# Patient Record
Sex: Male | Born: 2000 | Race: White | Hispanic: No | Marital: Single | Smoking: Never smoker
Health system: Southern US, Community
[De-identification: ages and names within clinical notes are randomized; demographics above are authoritative.]

## PROBLEM LIST (undated history)

## (undated) DIAGNOSIS — J45909 Unspecified asthma, uncomplicated: Secondary | ICD-10-CM

## (undated) HISTORY — DX: Unspecified asthma, uncomplicated: J45.909

## (undated) HISTORY — PX: CYST REMOVAL WITH BONE GRAFT: SHX6365

---
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
## 2018-02-04 ENCOUNTER — Ambulatory Visit: Payer: Managed Care, Other (non HMO) | Admitting: Family Medicine

## 2018-02-04 ENCOUNTER — Encounter: Payer: Self-pay | Admitting: Family Medicine

## 2018-02-04 ENCOUNTER — Other Ambulatory Visit: Payer: Self-pay | Admitting: Family Medicine

## 2018-02-04 VITALS — BP 128/72 | Temp 98.2°F | Ht 73.03 in | Wt 197.0 lb

## 2018-02-04 DIAGNOSIS — J40 Bronchitis, not specified as acute or chronic: Secondary | ICD-10-CM

## 2018-02-04 DIAGNOSIS — J329 Chronic sinusitis, unspecified: Secondary | ICD-10-CM | POA: Diagnosis not present

## 2018-02-04 MED ORDER — BENZONATATE 150 MG PO CAPS: 150.0000 mg | ORAL_CAPSULE | Freq: Three times a day (TID) | ORAL | 0 refills | Status: DC | PRN

## 2018-02-04 MED ORDER — AZITHROMYCIN 250 MG PO TABS: ORAL_TABLET | ORAL | 0 refills | Status: DC

## 2018-02-04 NOTE — Patient Instructions (Signed)
Upper Respiratory Infection, Adult Most upper respiratory infections (URIs) are caused by a virus. A URI affects the nose, throat, and upper air passages. The most common type of URI is often called "the common cold." Follow these instructions at home:  Take medicines only as told by your doctor.  Gargle warm saltwater or take cough drops to comfort your throat as told by your doctor.  Use a warm mist humidifier or inhale steam from a shower to increase air moisture. This may make it easier to breathe.  Drink enough fluid to keep your pee (urine) clear or pale yellow.  Eat soups and other clear broths.  Have a healthy diet.  Rest as needed.  Go back to work when your fever is gone or your doctor says it is okay. ? You may need to stay home longer to avoid giving your URI to others. ? You can also wear a face mask and wash your hands often to prevent spread of the virus.  Use your inhaler more if you have asthma.  Do not use any tobacco products, including cigarettes, chewing tobacco, or electronic cigarettes. If you need help quitting, ask your doctor. Contact a doctor if:  You are getting worse, not better.  Your symptoms are not helped by medicine.  You have chills.  You are getting more short of breath.  You have brown or red mucus.  You have yellow or brown discharge from your nose.  You have pain in your face, especially when you bend forward.  You have a fever.  You have puffy (swollen) neck glands.  You have pain while swallowing.  You have white areas in the back of your throat. Get help right away if:  You have very bad or constant: ? Headache. ? Ear pain. ? Pain in your forehead, behind your eyes, and over your cheekbones (sinus pain). ? Chest pain.  You have long-lasting (chronic) lung disease and any of the following: ? Wheezing. ? Long-lasting cough. ? Coughing up blood. ? A change in your usual mucus.  You have a stiff neck.  You have  changes in your: ? Vision. ? Hearing. ? Thinking. ? Mood. This information is not intended to replace advice given to you by your health care provider. Make sure you discuss any questions you have with your health care provider. Document Released: 09/23/2007 Document Revised: 12/08/2015 Document Reviewed: 07/12/2013 Elsevier Interactive Patient Education  2018 Elsevier Inc.  

## 2018-02-04 NOTE — Progress Notes (Signed)
Chief Complaint  Patient presents with  . Cough    x 5 days. Fever earlier in the week. Has taken cough medicine.     HPI  Patient presents today for Patient presents with upper respiratory congestion.  There is no sore throat. Patient reports coughing frequently as well. Nosputum noted. There is no fever, chills or sweats. The patient reports being short of breath. Has a lot of second hand exposure to smoke in the home.Onset was 5 days ago. Gradually worsening. Tried OTCs without improvement.  PMH: Smoking status noted ROS: Per HPI  Objective: BP 128/72 (BP Location: Left Arm, Patient Position: Sitting, Cuff Size: Normal)   Temp 98.2 F (36.8 C) (Oral)   Ht 6' 1.03" (1.855 m)   Wt 197 lb (89.4 kg)   SpO2 98%   BMI 25.97 kg/m  Gen: NAD, alert, cooperative with exam HEENT: NCAT, Nasal passages swollen, red TMS clear CV: RRR, good S1/S2, no murmur Resp: Bronchitis changes with scattered wheezes, non-labored Ext: No edema, warm Neuro: Alert and oriented, No gross deficits  Assessment and plan:  1. Sinobronchitis     Meds ordered this encounter  Medications  . azithromycin (ZITHROMAX Z-PAK) 250 MG tablet    Sig: Take two right away Then one a day for the next 4 days.    Dispense:  6 each    Refill:  0  . Benzonatate 150 MG CAPS    Sig: Take 1 capsule (150 mg total) by mouth 3 (three) times daily as needed (cough).    Dispense:  30 capsule    Refill:  0    No orders of the defined types were placed in this encounter.   Follow up as needed.  Mechele Claude, MD

## 2018-02-07 ENCOUNTER — Other Ambulatory Visit: Payer: Self-pay | Admitting: Family Medicine

## 2018-02-07 MED ORDER — BENZONATATE 200 MG PO CAPS: 200.0000 mg | ORAL_CAPSULE | Freq: Three times a day (TID) | ORAL | 0 refills | Status: DC | PRN

## 2018-02-07 NOTE — Telephone Encounter (Signed)
Was already responded to by other means, 200 mg was sent in earlier today

## 2018-02-07 NOTE — Addendum Note (Signed)
Addended by: Julious Payer D on: 02/07/2018 12:10 PM   Modules accepted: Orders

## 2018-02-07 NOTE — Telephone Encounter (Signed)
Pharmacy comment: prefer 100mg  or 200mg  Please advise

## 2018-02-07 NOTE — Addendum Note (Signed)
Addended by: Julious Payer D on: 02/07/2018 12:12 PM   Modules accepted: Orders

## 2018-02-17 ENCOUNTER — Ambulatory Visit (INDEPENDENT_AMBULATORY_CARE_PROVIDER_SITE_OTHER): Payer: Managed Care, Other (non HMO) | Admitting: Family Medicine

## 2018-02-17 ENCOUNTER — Encounter: Payer: Self-pay | Admitting: Family Medicine

## 2018-02-17 VITALS — BP 134/76 | HR 100 | Temp 97.8°F | Ht 73.0 in | Wt 198.0 lb

## 2018-02-17 DIAGNOSIS — R631 Polydipsia: Secondary | ICD-10-CM | POA: Diagnosis not present

## 2018-02-17 DIAGNOSIS — R251 Tremor, unspecified: Secondary | ICD-10-CM | POA: Diagnosis not present

## 2018-02-17 DIAGNOSIS — R5383 Other fatigue: Secondary | ICD-10-CM

## 2018-02-17 NOTE — Patient Instructions (Signed)

## 2018-02-17 NOTE — Progress Notes (Signed)
Subjective:    Patient ID: Lucas Erickson, male    DOB: 2001/02/10, 17 y.o.   MRN: [id number]  Chief Complaint:  Excessive thirst, shakiness,fatigue, weight loss   HPI: Lucas Erickson is a 17 y.o. male presenting on 02/17/2018 for Excessive thirst, shakiness,fatigue, weight loss  Pt presents today with intermittent episodes of light-headedness, shakiness, fatigue, polydipsia, polyuria, and weight loss. Pt is concerned because his mother has a history of hypoglycemia. He states this has been happening over the last few weeks. States it is more noticeable if has not eaten. Denies fever, chills, chest pain, shortness of breath, changes in hair or skin. Denies changes in bowel habits.   Relevant past medical, surgical, family, and social history reviewed and updated as indicated.  Allergies and medications reviewed and updated.   Past Medical History:  Diagnosis Date  . Asthma 2011    History reviewed. No pertinent surgical history.  Social History   Socioeconomic History  . Marital status: Single    Spouse name: Not on file  . Number of children: Not on file  . Years of education: Not on file  . Highest education level: Not on file  Occupational History  . Not on file  Social Needs  . Financial resource strain: Not on file  . Food insecurity:    Worry: Not on file    Inability: Not on file  . Transportation needs:    Medical: Not on file    Non-medical: Not on file  Tobacco Use  . Smoking status: Passive Smoke Exposure - Never Smoker  . Smokeless tobacco: Never Used  Substance and Sexual Activity  . Alcohol use: No  . Drug use: No  . Sexual activity: Not on file  Lifestyle  . Physical activity:    Days per week: Not on file    Minutes per session: Not on file  . Stress: Not on file  Relationships  . Social connections:    Talks on phone: Not on file    Gets together: Not on file    Attends religious service: Not on file    Active member of club or organization:  Not on file    Attends meetings of clubs or organizations: Not on file    Relationship status: Not on file  . Intimate partner violence:    Fear of current or ex partner: Not on file    Emotionally abused: Not on file    Physically abused: Not on file    Forced sexual activity: Not on file  Other Topics Concern  . Not on file  Social History Narrative  . Not on file    Outpatient Encounter Medications as of 02/17/2018  Medication Sig  . [DISCONTINUED] azithromycin (ZITHROMAX Z-PAK) 250 MG tablet Take two right away Then one a day for the next 4 days.  . [DISCONTINUED] benzonatate (TESSALON) 200 MG capsule Take 1 capsule (200 mg total) by mouth 3 (three) times daily as needed for cough.   No facility-administered encounter medications on file as of 02/17/2018.     No Known Allergies  Review of Systems  Constitutional: Positive for appetite change, fatigue and unexpected weight change. Negative for activity change, chills and fever.  Eyes: Negative for photophobia and visual disturbance.  Respiratory: Negative for cough, choking, chest tightness and shortness of breath.   Cardiovascular: Negative for chest pain, palpitations and leg swelling.  Gastrointestinal: Negative for abdominal pain, constipation, diarrhea, nausea and vomiting.  Endocrine: Positive for  polydipsia, polyphagia and polyuria. Negative for cold intolerance and heat intolerance.  Genitourinary: Negative for decreased urine volume, dysuria, flank pain, hematuria, penile pain and urgency.  Musculoskeletal: Negative for arthralgias and myalgias.  Neurological: Positive for dizziness and light-headedness. Negative for tremors, seizures, syncope, facial asymmetry, speech difficulty, weakness, numbness and headaches.  Psychiatric/Behavioral: Negative for confusion.  All other systems reviewed and are negative.       Objective:    BP 134/76   Pulse 100   Temp 97.8 F (36.6 C) (Oral)   Ht _0  (1.854 m)   Wt 198  lb (89.8 kg)   BMI 26.12 kg/m    Wt Readings from Last 3 Encounters:  02/17/18 198 lb (89.8 kg) (92 %, Z= 1.42)*  02/04/18 197 lb (89.4 kg) (92 %, Z= 1.40)*  11/04/17 210 lb (95.3 kg) (96 %, Z= 1.71)*   * Growth percentiles are based on CDC (Boys, 2-20 Years) data.    Physical Exam  Constitutional: He is oriented to person, place, and time. He appears well-developed and well-nourished. He is cooperative. No distress.  HENT:  Head: Normocephalic and atraumatic.  Right Ear: Hearing, tympanic membrane, external ear and ear canal normal.  Left Ear: Hearing, tympanic membrane, external ear and ear canal normal.  Nose: Nose normal.  Mouth/Throat: Uvula is midline, oropharynx is clear and moist and mucous membranes are normal. No oropharyngeal exudate.  Eyes: Pupils are equal, round, and reactive to light. Conjunctivae, EOM and lids are normal.  Neck: Trachea normal, normal range of motion and phonation normal. Neck supple. No JVD present. No tracheal tenderness present. Carotid bruit is not present. No tracheal deviation present. No thyroid mass and no thyromegaly present.  Cardiovascular: Normal rate, regular rhythm, normal heart sounds and intact distal pulses. Exam reveals no gallop and no friction rub.  No murmur heard. Pulmonary/Chest: Effort normal and breath sounds normal. No respiratory distress.  Abdominal: Soft. Bowel sounds are normal. He exhibits no distension and no mass. There is no tenderness. No hernia.  Musculoskeletal: Normal range of motion.  Lymphadenopathy:    He has no cervical adenopathy.  Neurological: He is alert and oriented to person, place, and time. He displays normal reflexes. No cranial nerve deficit or sensory deficit. He exhibits normal muscle tone. Coordination normal.  Skin: Skin is warm and dry. Capillary refill takes less than 2 seconds. No rash noted.  Psychiatric: He has a normal mood and affect. His speech is normal and behavior is normal. Judgment and  thought content normal. Cognition and memory are normal.  Nursing note and vitals reviewed.   Results for orders placed or performed in visit on 11/04/17  Rapid Strep Screen ([facility] ONLY)  Result Value Ref Range   Strep Gp A Ag, IA W/Reflex Negative Negative  Culture, Group A Strep  Result Value Ref Range   Strep A Culture CANCELED   CBC with Differential  Result Value Ref Range   WBC 8.8 3.4 - 10.8 x10E3/uL   RBC 5.14 4.14 - 5.80 x10E6/uL   Hemoglobin 15.0 13.0 - 17.7 g/dL   Hematocrit 43.8 37.5 - 51.0 %   MCV 85 79 - 97 fL   MCH 29.2 26.6 - 33.0 pg   MCHC 34.2 31.5 - 35.7 g/dL   RDW 14.6 12.3 - 15.4 %   Platelets 200 150 - 450 x10E3/uL   Neutrophils 22 Not Estab. %   Lymphs 60 Not Estab. %   Monocytes 14 Not Estab. %  Eos 1 Not Estab. %   Basos 3 Not Estab. %   Neutrophils Absolute 2.0 1.4 - 7.0 x10E3/uL   Lymphocytes Absolute 5.4 (H) 0.7 - 3.1 x10E3/uL   Monocytes Absolute 1.2 (H) 0.1 - 0.9 x10E3/uL   EOS (ABSOLUTE) 0.1 0.0 - 0.4 x10E3/uL   Basophils Absolute 0.2 0.0 - 0.2 x10E3/uL   Immature Granulocytes 0 Not Estab. %   Immature Grans (Abs) 0.0 0.0 - 0.1 x10E3/uL   Hematology Comments: Note:   Mononucleosis Test, Qual W/ Reflex  Result Value Ref Range   Mono Qual W/Rflx Qn Positive (A) Negative  Mononucleosis Test, Quant  Result Value Ref Range   MONO TITER 1:8 (H) Negative       Pertinent labs & imaging results that were available during my care of the patient were reviewed by me and considered in my medical decision making.  Assessment & Plan:  Roark was seen today for excessive thirst, shakiness,fatigue, weight loss.  Diagnoses and all orders for this visit:  Intermittent episodes of light-headedness, shakiness, fatigue, polydipsia, polyuria, and weight loss. Mother has a history of hypoglycemia. Will check for possible hypoglycemia or thyroid diease. Pt will keep a log of episodes, situation surrounding episodes, and check blood sugar during episodes.     Other fatigue -     CMP14+EGFR -     CBC with Differential/Platelet -     TSH  Polydipsia -     CMP14+EGFR -     CBC with Differential/Platelet -     TSH  Shakiness -     CMP14+EGFR -     CBC with Differential/Platelet -     TSH      Continue all other maintenance medications.  Follow up plan: Return if symptoms worsen or fail to improve.  Educational handout given for fatigue  The above assessment and management plan was discussed with the patient. The patient verbalized understanding of and has agreed to the management plan. Patient is aware to call the clinic if symptoms persist or worsen. Patient is aware when to return to the clinic for a follow-up visit. Patient educated on when it is appropriate to go to the emergency department.   Monia Pouch, FNP-C [facility] [phone]

## 2018-02-18 LAB — CBC WITH DIFFERENTIAL/PLATELET
Basophils Absolute: 0.1 10*3/uL (ref 0.0–0.2)
Basos: 1 %
EOS (ABSOLUTE): 0.1 10*3/uL (ref 0.0–0.4)
Eos: 2 %
Hematocrit: 45.5 % (ref 37.5–51.0)
Hemoglobin: 15.5 g/dL (ref 13.0–17.7)
Immature Grans (Abs): 0 10*3/uL (ref 0.0–0.1)
Immature Granulocytes: 0 %
Lymphocytes Absolute: 1.6 10*3/uL (ref 0.7–3.1)
Lymphs: 25 %
MCH: 28.7 pg (ref 26.6–33.0)
MCHC: 34.1 g/dL (ref 31.5–35.7)
MCV: 84 fL (ref 79–97)
Monocytes Absolute: 0.8 10*3/uL (ref 0.1–0.9)
Monocytes: 13 %
Neutrophils Absolute: 3.6 10*3/uL (ref 1.4–7.0)
Neutrophils: 59 %
Platelets: 269 10*3/uL (ref 150–450)
RBC: 5.41 x10E6/uL (ref 4.14–5.80)
RDW: 14 % (ref 12.3–15.4)
WBC: 6.1 10*3/uL (ref 3.4–10.8)

## 2018-02-18 LAB — CMP14+EGFR
ALBUMIN: 4.6 g/dL (ref 3.5–5.5)
ALK PHOS: 108 IU/L (ref 39–117)
ALT: 27 IU/L (ref 0–44)
AST: 24 IU/L (ref 0–40)
Albumin/Globulin Ratio: 1.8 (ref 1.2–2.2)
BILIRUBIN TOTAL: 0.7 mg/dL (ref 0.0–1.2)
BUN/Creatinine Ratio: 10 (ref 9–20)
BUN: 9 mg/dL (ref 6–20)
CHLORIDE: 102 mmol/L (ref 96–106)
CO2: 23 mmol/L (ref 20–29)
CREATININE: 0.91 mg/dL (ref 0.76–1.27)
Calcium: 10 mg/dL (ref 8.7–10.2)
GFR calc Af Amer: 141 mL/min/{1.73_m2} (ref >59)
GFR calc non Af Amer: 122 mL/min/{1.73_m2} (ref >59)
Globulin, Total: 2.6 g/dL (ref 1.5–4.5)
Glucose: 85 mg/dL (ref 65–99)
Potassium: 4.4 mmol/L (ref 3.5–5.2)
Sodium: 141 mmol/L (ref 134–144)
TOTAL PROTEIN: 7.2 g/dL (ref 6.0–8.5)

## 2018-02-18 LAB — TSH: TSH: 1.52 u[IU]/mL (ref 0.450–4.500)

## 2018-05-04 ENCOUNTER — Telehealth: Payer: Self-pay | Admitting: Orthopaedic Surgery

## 2018-05-04 NOTE — Telephone Encounter (Signed)
Patient stopped into our office inquiring about requesting records from his orthopaedist in [place], so that he may schedule appointment. Discussed protocol in which Drs will need to review records/films, and if approved, appointment can then be scheduled.  Faxed patient's signed request to [facility] fax for medical records: #[phone] / ph#[phone] - patient aware of status.

## 2018-05-17 ENCOUNTER — Encounter: Payer: Self-pay | Admitting: Orthopaedic Surgery

## 2018-05-17 ENCOUNTER — Ambulatory Visit: Payer: Managed Care, Other (non HMO) | Admitting: Orthopaedic Surgery

## 2018-05-17 VITALS — BP 139/78 | HR 69 | Ht 73.0 in | Wt 211.0 lb

## 2018-05-17 DIAGNOSIS — M25551 Pain in right hip: Secondary | ICD-10-CM

## 2018-05-17 NOTE — Addendum Note (Signed)
Addended by: Baird Kay on: 05/17/2018 04:47 PM   Modules accepted: Orders
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
## 2018-05-17 NOTE — Patient Instructions (Signed)
Note for work.  Limit activities of extra walking, climbing.

## 2018-05-23 ENCOUNTER — Telehealth: Payer: Self-pay | Admitting: Orthopaedic Surgery

## 2018-05-23 NOTE — Telephone Encounter (Signed)
Patient called stating he has canceled his MRI with his insurance. Stated you can go ahead and order it now.

## 2018-06-01 ENCOUNTER — Ambulatory Visit
Admission: RE | Admit: 2018-06-01 | Discharge: 2018-06-01 | Disposition: A | Discharge status: Home / Self Care | Payer: Managed Care, Other (non HMO) | Source: Ambulatory Visit | Attending: Orthopaedic Surgery | Admitting: Orthopaedic Surgery

## 2018-06-01 DIAGNOSIS — M25551 Pain in right hip: Secondary | ICD-10-CM

## 2018-06-01 MED ORDER — IOPAMIDOL (ISOVUE-M 200) INJECTION 41%
20.0000 mL | Freq: Once | INTRAMUSCULAR | Status: AC
  Administered 2018-06-01: 20 mL via INTRA_ARTICULAR

## 2018-06-07 ENCOUNTER — Encounter: Payer: Self-pay | Admitting: Orthopaedic Surgery

## 2018-06-07 ENCOUNTER — Ambulatory Visit (INDEPENDENT_AMBULATORY_CARE_PROVIDER_SITE_OTHER): Payer: Managed Care, Other (non HMO) | Admitting: Orthopaedic Surgery

## 2018-06-07 VITALS — BP 117/83 | HR 109 | Ht 73.0 in | Wt 211.0 lb

## 2018-06-07 DIAGNOSIS — M25551 Pain in right hip: Secondary | ICD-10-CM

## 2018-06-07 NOTE — Progress Notes (Signed)
Patient NO:Lucas Erickson, male DOB:April 15, 2001, 18 y.o. HAF:[id number]  Chief Complaint  Patient presents with  . Hip Pain    Right hip pain    HPI  Lucas Erickson is a 18 y.o. male who has right hip pain.  He had a MRI which showed: IMPRESSION: 1. No labral tear of the right hip.  No fracture or dislocation. 2. 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 Multiple fluid fluid levels within the mass. The mass abuts the articular surface at the superior anterior acetabulum. Differential considerations include aneurysmal bone cyst, chondromyxoid fibroma, giant cell tumor of the bone. Recommend further evaluation with a MRI of the pelvis without and with intravenous contrast.  His mother is present.  I have explained the findings to both of them and have given them a copy of the MRI.  I will get the new MRI with and without contrast as recommended.    I will also arrange for him to be seen at [facility].  He may need a biopsy done.  They are agreeable to this.    Body mass index is 27.84 kg/m.  ROS  Review of Systems  Constitutional: Positive for activity change.  Musculoskeletal: Positive for arthralgias and gait problem.  All other systems reviewed and are negative.   All other systems reviewed and are negative.  The following is a summary of the past history medically, past history surgically, known current medicines, social history and family history.  This information is gathered electronically by the computer from prior information and documentation.  I review this each visit and have found including this information at this point in the chart is beneficial and informative.    Past Medical History:  Diagnosis Date  . Asthma 2011    History reviewed. No pertinent surgical history.  Family History  Problem Relation Age of Onset  . Diabetes Father        pre-diabetic  . Hypertension Paternal  Grandmother     Social History Social History   Tobacco Use  . Smoking status: Passive Smoke Exposure - Never Smoker  . Smokeless tobacco: Never Used  Substance Use Topics  . Alcohol use: No  . Drug use: No    No Known Allergies  No current outpatient medications on file.   No current facility-administered medications for this visit.      Physical Exam  Blood pressure 117/83, pulse (!) 109, height 6\' 1"  (1.854 m), weight 211 lb (95.7 kg).  Constitutional: overall normal hygiene, normal nutrition, well developed, normal grooming, normal body habitus. Assistive device:none  Musculoskeletal: gait and station Limp right, muscle tone and strength are normal, no tremors or atrophy is present.  .  Neurological: coordination overall normal.  Deep tendon reflex/nerve stretch intact.  Sensation normal.  Cranial nerves II-XII intact.   Skin:   Normal overall no scars, lesions, ulcers or rashes. No psoriasis.  Psychiatric: Alert and oriented x 3.  Recent memory intact, remote memory unclear.  Normal mood and affect. Well groomed.  Good eye contact.  Cardiovascular: overall no swelling, no varicosities, no edema bilaterally, normal temperatures of the legs and arms, no clubbing, cyanosis and good capillary refill.  Lymphatic: palpation is normal.  He has full ROM of the right hip.  NV intact.  Limp to the right.  All other systems reviewed and are negative   The patient has been educated about the nature of the problem(s)  and counseled on treatment options.  The patient appeared to understand what I have discussed and is in agreement with it.  Encounter Diagnosis  Name Primary?  . Pain of right hip joint Yes    PLAN Call if any problems.  Precautions discussed.  Continue current medications.   Return to clinic to get new MRI of the pelvis, then to be seen at [facility].   Electronically Signed Darreld Mclean, MD 2/18/202011:16 AM

## 2018-06-09 ENCOUNTER — Telehealth: Payer: Self-pay | Admitting: Orthopaedic Surgery

## 2018-06-09 DIAGNOSIS — M25551 Pain in right hip: Secondary | ICD-10-CM

## 2018-06-09 NOTE — Telephone Encounter (Signed)
Moxon's grandmother, Stavro Alfredo called for him.  They wanted to know as soon as Devaj has his MRI, can we get him scheduled at [facility] or possibly go ahead and send notes to get him scheduled before he has he MRI.  I told her that I was unsure if Seab's records had already been sent for referral but I would have you call her.  Please call Rudie Serven at [phone]  Thanks

## 2018-06-10 NOTE — Telephone Encounter (Signed)
Grandmother given information to call [facility] and referral with office notes faxed.

## 2018-06-12 ENCOUNTER — Ambulatory Visit
Admission: RE | Admit: 2018-06-12 | Discharge: 2018-06-12 | Disposition: A | Discharge status: Home / Self Care | Payer: Managed Care, Other (non HMO) | Source: Ambulatory Visit | Attending: Orthopaedic Surgery | Admitting: Orthopaedic Surgery

## 2018-06-12 DIAGNOSIS — M25551 Pain in right hip: Secondary | ICD-10-CM

## 2018-06-12 MED ORDER — GADOBENATE DIMEGLUMINE 529 MG/ML IV SOLN
15.0000 mL | Freq: Once | INTRAVENOUS | Status: AC | PRN
  Administered 2018-06-12: 15 mL via INTRAVENOUS

## 2018-07-05 DIAGNOSIS — M8550 Aneurysmal bone cyst, unspecified site: Secondary | ICD-10-CM | POA: Insufficient documentation

## 2018-09-16 HISTORY — PX: OTHER SURGICAL HISTORY: SHX169

## 2018-12-15 ENCOUNTER — Ambulatory Visit: Payer: Managed Care, Other (non HMO) | Attending: Orthopedic Surgery | Admitting: Physical Therapy

## 2018-12-15 ENCOUNTER — Other Ambulatory Visit: Payer: Self-pay

## 2018-12-15 ENCOUNTER — Encounter: Payer: Self-pay | Admitting: Physical Therapy

## 2018-12-15 DIAGNOSIS — R2689 Other abnormalities of gait and mobility: Secondary | ICD-10-CM

## 2018-12-15 DIAGNOSIS — M25551 Pain in right hip: Secondary | ICD-10-CM | POA: Diagnosis present

## 2018-12-15 NOTE — Therapy (Signed)
[facility] [address] [place], [place], [address] Phone: [phone]   Fax:  [phone]  Physical Therapy Evaluation  Patient Details  Name: Lucas Erickson MRN: [id number] Date of Birth: October 29, 2000 Referring Provider (PT): Cynthis Amaryllis DykeLynn Emory MD   Encounter Date: 12/15/2018  PT End of Session - 12/15/18 1330    Visit Number  1    Number of Visits  12    Date for PT Re-Evaluation  01/26/19    Authorization Type  PROGRESS NOTE AT 10TH VISIT.    PT Start Time  0101    PT Stop Time  0128    PT Time Calculation (min)  27 min    Activity Tolerance  Patient tolerated treatment well    Behavior During Therapy  WFL for tasks assessed/performed       Past Medical History:  Diagnosis Date  . Asthma 2011    History reviewed. No pertinent surgical history.  There were no vitals filed for this visit.   Subjective Assessment - 12/15/18 1332    Subjective  COVID-19 screen performed prior to patient entering clinic.  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  He presented to the clinic today with bilateral axillary crutches and weight bearing as tolerated over his right LE.  He can wean from the crutches at this time and he has walked some around his house without his crutches.  His pain-level today is a low 2/1o at rest.    Patient Stated Goals  "Be able to walk like normal again with minimal pain."    Currently in Pain?  Yes    Pain Score  2     Pain Location  Hip    Pain Orientation  Right    Pain Descriptors / Indicators  Aching;Dull;Discomfort    Pain Type  Surgical pain    Pain Onset  More than a month ago    Pain Frequency  Intermittent    Aggravating Factors   Over activity and increased up time.    Pain Relieving Factors  rest.         [facility] PT Assessment - 12/15/18 0001      Assessment   Medical Diagnosis  Neoplasm of unspecified behavior of  bone, soft tissue and skin.    Referring Provider (PT)  Cynthis Amaryllis DykeLynn Emory MD    Onset Date/Surgical Date  --   09/16/18(surgery date).     Precautions   Precaution Comments  Wean from crutches.  Slow and pain-free exercise.      Restrictions   Weight Bearing Restrictions  --   WBAT over right LE.     Balance Screen   Has the patient fallen in the past 6 months  No    Has the patient had a decrease in activity level because of a fear of falling?   No    Is the patient reluctant to leave their home because of a fear of falling?   No      Home Environment   Living Environment  Private residence      Prior Function   Level of Independence  Independent      Observation/Other Assessments   Observations  Long incision in right lower abdominal region that is well healed.      Posture/Postural Control   Posture/Postural Control  No significant limitations      ROM / Strength  AROM / PROM / Strength  AROM;Strength      AROM   Overall AROM Comments  In supine patient's right hip flexion easily performed to 105 degrees without pain increase.      Strength   Overall Strength Comments  Patient demonstrated right hip antigravity motion with difficulty or pain increase.      Palpation   Palpation comment  Mild incisional tenderness.      Ambulation/Gait   Gait Comments  Instructed patient in weaning to one axillary crutch on left.  He did so with excellent technique.                Objective measurements completed on examination: See above findings.                   PT Long Term Goals - 12/15/18 1412      PT LONG TERM GOAL #1   Title  Independent with a HEP.    Time  6    Period  Weeks    Status  New      PT LONG TERM GOAL #2   Title  Increase right hip strength to a solid 5/5 to increase stability for functional activties.    Time  6    Period  Weeks    Status  New      PT LONG TERM GOAL #3   Title  Walk a community distance without assistive  device and no pain.    Time  6    Period  Weeks    Status  New      PT LONG TERM GOAL #4   Title  Perform ADL's with pain not > 2/10.    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  He presented to the clinic today with bilateral axillary crutches and weight bearing as tolerated over his right LE.  He walked in the clinic today with one crutch on left and did an excellent job.  His right lower abdominal incision is well healed.  He easily performed right hip antigavity movement with an increase in pain.  Patient will benefit from skilled physical therapy intervention to address deficits.n.    Examination-Activity Limitations  Locomotion Level    Stability/Clinical Decision Making  Stable/Uncomplicated    Clinical Decision Making  Moderate    Rehab Potential  Excellent    PT Frequency  2x / week    PT Duration  6 weeks    PT Treatment/Interventions  ADLs/Self Care Home Management;Cryotherapy;Electrical Stimulation;Gait training;Moist Heat;Functional mobility training;Therapeutic activities;Therapeutic exercise;Patient/family education;Passive range of motion;Manual techniques    PT Next Visit Plan  Please do FOTO intake next visit.  Nustep, gait training activities, PRE's without pain increase.  Modalites as needed.    Consulted and Agree with Plan of Care  Patient       Patient will benefit from skilled therapeutic intervention in order to improve the following deficits and impairments:  Abnormal gait, Pain, Decreased activity tolerance, Decreased strength  Visit Diagnosis: Pain in right hip - Plan: PT plan of care cert/re-cert  Other abnormalities of gait and mobility - Plan: PT plan of care cert/re-cert     Problem List There  are no active problems to display for this  patient.   Lucas Erickson, [place] MPT 12/15/2018, 2:18 PM  [facility] [address] [place], [place], [address] Phone: [phone]   Fax:  [phone]  Name: Lucas Erickson MRN: [id number] Date of Birth: 04-Apr-2001

## 2018-12-20 ENCOUNTER — Ambulatory Visit: Payer: Managed Care, Other (non HMO) | Attending: Orthopedic Surgery | Admitting: *Deleted

## 2018-12-20 ENCOUNTER — Other Ambulatory Visit: Payer: Self-pay

## 2018-12-20 DIAGNOSIS — M25551 Pain in right hip: Secondary | ICD-10-CM

## 2018-12-20 DIAGNOSIS — R2689 Other abnormalities of gait and mobility: Secondary | ICD-10-CM | POA: Diagnosis present

## 2018-12-20 NOTE — Therapy (Signed)
[facility] [place], [place], [address] Phone: [phone]   Fax:  [phone]  Physical Therapy Treatment  Patient Details  Name: Lucas Erickson MRN: [id number] Date of Birth: [birth year] Referring Provider (PT): Cynthis Beatriz Chancellor MD   Encounter Date: 12/20/2018  PT End of Session - 12/20/18 0160    Visit Number  2    Number of Visits  12    Date for PT Re-Evaluation  01/26/19    Authorization Type  PROGRESS NOTE AT 10TH VISIT.    PT Start Time  0815    PT Stop Time  0904    PT Time Calculation (min)  49 min       Past Medical History:  Diagnosis Date  . Asthma 2011    No past surgical history on file.  There were no vitals filed for this visit.  Subjective Assessment - 12/20/18 0821    Subjective  COVID19 screening performed prior to entering the building. Pt reports RT hip is sore , but doing better    Patient Stated Goals  "Be able to walk like normal again with minimal pain."    Currently in Pain?  Yes    Pain Score  2     Pain Location  Hip    Pain Onset  More than a month ago                       [facility] Adult PT Treatment/Exercise - 12/20/18 0001      Exercises   Exercises  Knee/Hip      Knee/Hip Exercises: Aerobic   Nustep  L4 x 15 mins      Knee/Hip Exercises: Standing   Rocker Board  5 minutes   balance     Knee/Hip Exercises: Seated   Long Arc Quad  Right;Strengthening;3 sets;10 reps    Long Arc Quad Weight  2 lbs.      Knee/Hip Exercises: Supine   Short Arc Quad Sets  Strengthening;Right;3 sets;10 reps    Short Arc Quad Sets Limitations  2#    Bridges  AROM;3 sets;10 reps    Straight Leg Raises  AROM;Right;3 sets;10 reps      Knee/Hip Exercises: Sidelying   Hip ABduction  AROM;Right;3 sets;10 reps    Hip ADduction  AROM;3 sets;10 reps      Knee/Hip Exercises: Prone   Straight Leg Raises  AROM;Right;3 sets;10 reps             PT Education - 12/20/18 0852     Education Details  RT LE mat exs    Person(s) Educated  Patient    Methods  Explanation;Demonstration;Tactile cues;Verbal cues;Handout    Comprehension  Verbalized understanding;Returned demonstration          PT Long Term Goals - 12/15/18 1412      PT LONG TERM GOAL #1   Title  Independent with a HEP.    Time  6    Period  Weeks    Status  New      PT LONG TERM GOAL #2   Title  Increase right hip strength to a solid 5/5 to increase stability for functional activties.    Time  6    Period  Weeks    Status  New      PT LONG TERM GOAL #3   Title  Walk a community distance without assistive device and no pain.    Time  6  Period  Weeks    Status  New      PT LONG TERM GOAL #4   Title  Perform ADL's with pain not > 2/10.    Time  6    Period  Weeks    Status  New            Plan - 12/20/18 [id number]    Clinical Impression Statement  Pt arrived today doing fairly well and was able to perform therex in the clinic for HEP and did well. Sheet given for instruction. He was able to walk in clinic without his crutch today with mild antalgic gait pattern. Notable weakness with SLR and SAQ's.    Examination-Activity Limitations  Locomotion Level    Stability/Clinical Decision Making  Stable/Uncomplicated    Rehab Potential  Excellent    PT Frequency  2x / week    PT Duration  6 weeks    PT Treatment/Interventions  ADLs/Self Care Home Management;Cryotherapy;Electrical Stimulation;Gait training;Moist Heat;Functional mobility training;Therapeutic activities;Therapeutic exercise;Patient/family education;Passive range of motion;Manual techniques    PT Next Visit Plan  Please do FOTO intake next visit.  Nustep, gait training activities, PRE's without pain increase.  Modalites as needed.       Patient will benefit from skilled therapeutic intervention in order to improve the following deficits and impairments:  Abnormal gait, Pain, Decreased activity tolerance, Decreased  strength  Visit Diagnosis: Pain in right hip  Other abnormalities of gait and mobility     Problem List There are no active problems to display for this patient.   RAMSEUR,CHRIS , PTA 12/20/2018, 9:36 AM  [facility] [address] [place], [place], [address] Phone: [phone]   Fax:  [phone]  Name: Lucas Erickson MRN: [id number] Date of Birth: June 22, 2000

## 2018-12-23 ENCOUNTER — Ambulatory Visit: Payer: Managed Care, Other (non HMO) | Admitting: *Deleted

## 2018-12-23 ENCOUNTER — Other Ambulatory Visit: Payer: Self-pay

## 2018-12-23 DIAGNOSIS — M25551 Pain in right hip: Secondary | ICD-10-CM

## 2018-12-23 DIAGNOSIS — R2689 Other abnormalities of gait and mobility: Secondary | ICD-10-CM

## 2018-12-23 NOTE — Therapy (Signed)
[facility] [address] [place], [place], [address] Phone: [phone]   Fax:  [phone]  Physical Therapy Treatment  Patient Details  Name: Lucas Erickson MRN: [id number] Date of Birth: 11-06-2000 Referring Provider (PT): Cynthis Amaryllis DykeLynn Emory MD   Encounter Date: 12/23/2018  PT End of Session - 12/23/18 0912    Visit Number  3    Number of Visits  12    Date for PT Re-Evaluation  01/26/19    Authorization Type  PROGRESS NOTE AT 10TH VISIT.    PT Start Time  0900    PT Stop Time  0947    PT Time Calculation (min)  47 min       Past Medical History:  Diagnosis Date  . Asthma 2011    No past surgical history on file.  There were no vitals filed for this visit.  Subjective Assessment - 12/23/18 0911    Subjective  COVID19 screening performed prior to entering the building. Pt reports RT hip is sore from exs and RTW    Patient Stated Goals  "Be able to walk like normal again with minimal pain."    Currently in Pain?  Yes    Pain Score  2     Pain Location  Hip    Pain Orientation  Right    Pain Descriptors / Indicators  Sore    Pain Type  Surgical pain    Pain Onset  More than a month ago    Pain Frequency  Intermittent                       [facility] Adult PT Treatment/Exercise - 12/23/18 0001      Exercises   Exercises  Knee/Hip      Knee/Hip Exercises: Aerobic   Nustep  L4 x 15 mins      Knee/Hip Exercises: Standing   Rocker Board  3 minutes   balance   Other Standing Knee Exercises  inverted BOSU x 4 mins    Other Standing Knee Exercises  resisted walking XTS orange band x 10 retro and LT side stepping      Knee/Hip Exercises: Seated   Long Arc Quad  Right;Strengthening;3 sets;10 reps    Long Arc Quad Weight  2 lbs.      Knee/Hip Exercises: Supine   Short Arc Quad Sets  Strengthening;Right;3 sets;10 reps    Short Arc Quad Sets Limitations  2#    Bridges with Harley-DavidsonBall Squeeze  Strengthening;3 sets;10  reps    Straight Leg Raises  AROM;Right;3 sets;10 reps                  PT Long Term Goals - 12/15/18 1412      PT LONG TERM GOAL #1   Title  Independent with a HEP.    Time  6    Period  Weeks    Status  New      PT LONG TERM GOAL #2   Title  Increase right hip strength to a solid 5/5 to increase stability for functional activties.    Time  6    Period  Weeks    Status  New      PT LONG TERM GOAL #3   Title  Walk a community distance without assistive device and no pain.    Time  6    Period  Weeks    Status  New      PT  LONG TERM GOAL #4   Title  Perform ADL's with pain not > 2/10.    Time  6    Period  Weeks    Status  New            Plan - 12/23/18 5320    Clinical Impression Statement  Pt arrived today doing fairly well with low pain levels. He was able to perform all therex today for RT LE with mainly soreness and fatigue. He did better with muscle control today during SLR, and LAQs, and SAQs    Examination-Activity Limitations  Locomotion Level    Stability/Clinical Decision Making  Stable/Uncomplicated    Rehab Potential  Excellent    PT Frequency  2x / week    PT Duration  6 weeks    PT Treatment/Interventions  ADLs/Self Care Home Management;Cryotherapy;Electrical Stimulation;Gait training;Moist Heat;Functional mobility training;Therapeutic activities;Therapeutic exercise;Patient/family education;Passive range of motion;Manual techniques    PT Next Visit Plan  Please do FOTO intake next visit.  Nustep, gait training activities, PRE's without pain increase.  Modalites as needed.    Consulted and Agree with Plan of Care  Patient       Patient will benefit from skilled therapeutic intervention in order to improve the following deficits and impairments:  Abnormal gait, Pain, Decreased activity tolerance, Decreased strength  Visit Diagnosis: Other abnormalities of gait and mobility  Pain in right hip     Problem List There are no active  problems to display for this patient.   Rami Budhu,CHRIS, PTA 12/23/2018, 10:18 AM  [facility] [place], [place], [address] Phone: [phone]   Fax:  [phone]  Name: Lucas Erickson MRN: [id number] Date of Birth: 05-05-2000

## 2018-12-28 ENCOUNTER — Other Ambulatory Visit: Payer: Self-pay

## 2018-12-28 ENCOUNTER — Ambulatory Visit: Payer: Managed Care, Other (non HMO) | Admitting: Physical Therapy

## 2018-12-28 ENCOUNTER — Encounter: Payer: Self-pay | Admitting: Physical Therapy

## 2018-12-28 DIAGNOSIS — R2689 Other abnormalities of gait and mobility: Secondary | ICD-10-CM

## 2018-12-28 DIAGNOSIS — M25551 Pain in right hip: Secondary | ICD-10-CM

## 2018-12-28 NOTE — Therapy (Signed)
[facility] [address] [place], [place], [address] Phone: [phone]   Fax:  [phone]  Physical Therapy Treatment  Patient Details  Name: Lucas Erickson MRN: [id number] Date of Birth: Aug 05, 2000 Referring Provider (PT): Cynthis Amaryllis DykeLynn Emory MD   Encounter Date: 12/28/2018  PT End of Session - 12/28/18 0823    Visit Number  4    Number of Visits  12    Date for PT Re-Evaluation  01/26/19    Authorization Type  PROGRESS NOTE AT 10TH VISIT.    PT Start Time  0730    PT Stop Time  0814    PT Time Calculation (min)  44 min    Activity Tolerance  Patient tolerated treatment well    Behavior During Therapy  [facility] for tasks assessed/performed       Past Medical History:  Diagnosis Date  . Asthma 2011    History reviewed. No pertinent surgical history.  There were no vitals filed for this visit.  Subjective Assessment - 12/28/18 0749    Subjective  COVID19 screening performed prior to entering the building. Patient reports no pain in the right hip and has not been using crutches lately.    Patient Stated Goals  "Be able to walk like normal again with minimal pain."    Currently in Pain?  No/denies         [facility] - 12/28/18 0001      Assessment   Medical Diagnosis  Neoplasm of unspecified behavior of bone, soft tissue and skin.    Referring Provider (PT)  Cynthis Amaryllis DykeLynn Emory MD                   [facility] Adult PT Treatment/Exercise - 12/28/18 0001      Exercises   Exercises  Knee/Hip      Knee/Hip Exercises: Aerobic   Nustep  L5 x 15 mins      Knee/Hip Exercises: Standing   Rocker Board  3 minutes   balance with ball toss.   Other Standing Knee Exercises  inverted BOSU with mini squats x 3x10    Other Standing Knee Exercises  4 way resisted walking orange XTS, x5 each      Knee/Hip Exercises: Seated   Long Arc Quad  Right;Strengthening;3 sets;10 reps    Long Arc Quad Weight  2 lbs.      Knee/Hip  Exercises: Supine   Short Arc Quad Sets  Strengthening;Right;3 sets;10 reps    Short Arc Quad Sets Limitations  2#    Straight Leg Raises  AROM;Right;3 sets;10 reps    Other Supine Knee/Hip Exercises  supine clams red theraband 3x10      Knee/Hip Exercises: Sidelying   Other Sidelying Knee/Hip Exercises  right side planks x3 to fatigue                  PT Long Term Goals - 12/15/18 1412      PT LONG TERM GOAL #1   Title  Independent with a HEP.    Time  6    Period  Weeks    Status  New      PT LONG TERM GOAL #2   Title  Increase right hip strength to a solid 5/5 to increase stability for functional activties.    Time  6    Period  Weeks    Status  New      PT LONG TERM GOAL #3   Title  Walk a community distance without assistive device and no pain.    Time  6    Period  Weeks    Status  New      PT LONG TERM GOAL #4   Title  Perform ADL's with pain not > 2/10.    Time  6    Period  Weeks    Status  New            Plan - 12/28/18 0801    Clinical Impression Statement  Patient was able to tolerate progression of exercises well with only minimal reports of soreness particularly with the bosu mini squats. Patient required one verbal cue to reduce speed of SLR and to drop leg down completely before lifting again. Patient and PT discusssed continuing HEP to optimize PT. Patient reported understanding.    Examination-Activity Limitations  Locomotion Level    Stability/Clinical Decision Making  Stable/Uncomplicated    Clinical Decision Making  Moderate    Rehab Potential  Excellent    PT Frequency  2x / week    PT Duration  6 weeks    PT Treatment/Interventions  ADLs/Self Care Home Management;Cryotherapy;Electrical Stimulation;Gait training;Moist Heat;Functional mobility training;Therapeutic activities;Therapeutic exercise;Patient/family education;Passive range of motion;Manual techniques    PT Next Visit Plan  Please do FOTO intake next visit.  Nustep, gait  training activities, PRE's without pain increase.  Modalites as needed.    Consulted and Agree with Plan of Care  Patient       Patient will benefit from skilled therapeutic intervention in order to improve the following deficits and impairments:  Abnormal gait, Pain, Decreased activity tolerance, Decreased strength  Visit Diagnosis: Other abnormalities of gait and mobility  Pain in right hip     Problem List There are no active problems to display for this patient.   Gabriela Eves, PT, DPT 12/28/2018, 8:23 AM  [facility] [address] [place], [place], [address] Phone: [phone]   Fax:  [phone]  Name: Lucas Erickson MRN: [id number] Date of Birth: 2000-06-01

## 2018-12-30 ENCOUNTER — Ambulatory Visit: Payer: Managed Care, Other (non HMO) | Admitting: *Deleted

## 2018-12-30 ENCOUNTER — Other Ambulatory Visit: Payer: Self-pay

## 2018-12-30 DIAGNOSIS — M25551 Pain in right hip: Secondary | ICD-10-CM

## 2018-12-30 DIAGNOSIS — R2689 Other abnormalities of gait and mobility: Secondary | ICD-10-CM

## 2018-12-30 NOTE — Therapy (Signed)
[facility] [address] [place], [place], [address] Phone: [phone]   Fax:  [phone]  Physical Therapy Treatment  Patient Details  Name: Lucas Erickson MRN: [id number] Date of Birth: 2000/10/02 Referring Provider (PT): Cynthis Amaryllis DykeLynn Emory MD   Encounter Date: 12/30/2018  PT End of Session - 12/30/18 0906    Visit Number  5    Number of Visits  12    Date for PT Re-Evaluation  01/26/19    Authorization Type  PROGRESS NOTE AT 10TH VISIT.    PT Start Time  0900    PT Stop Time  0949    PT Time Calculation (min)  49 min       Past Medical History:  Diagnosis Date  . Asthma 2011    No past surgical history on file.  There were no vitals filed for this visit.  Subjective Assessment - 12/30/18 0857    Subjective  COVID19 screening performed prior to entering the building. RT hip is sore from standing a lot yesterday.    Patient Stated Goals  "Be able to walk like normal again with minimal pain."    Currently in Pain?  No/denies    Pain Orientation  Right    Pain Descriptors / Indicators  Sore    Pain Onset  More than a month ago                       [facility] Adult [facility] Treatment/Exercise - 12/30/18 0001      Exercises   Exercises  Knee/Hip      Knee/Hip Exercises: Aerobic   Recumbent Bike  L5 x 10 mins    Stepper  ramp 10, R5 x 5 mins      Knee/Hip Exercises: Standing   Rocker Board  3 minutes   balance with ball toss.   Other Standing Knee Exercises  inverted BOSU with mini squats x 3x10    Other Standing Knee Exercises  4 way resisted walking orange XTS, x10 each      Knee/Hip Exercises: Seated   Long Arc Quad  Right;Strengthening;3 sets;10 reps    Long Arc Quad Weight  4 lbs.      Knee/Hip Exercises: Supine   Short Arc The Timken CompanyQuad Sets  --    Short Arc The Timken CompanyQuad Sets Limitations  --    Bridges with Harley-DavidsonBall Squeeze  Strengthening;3 sets;10 reps    Straight Leg Raises  AROM;Right;3 sets;10 reps                   PT Long Term Goals - 12/15/18 1412      PT LONG TERM GOAL #1   Title  Independent with a HEP.    Time  6    Period  Weeks    Status  New      PT LONG TERM GOAL #2   Title  Increase right hip strength to a solid 5/5 to increase stability for functional activties.    Time  6    Period  Weeks    Status  New      PT LONG TERM GOAL #3   Title  Walk a community distance without assistive device and no pain.    Time  6    Period  Weeks    Status  New      PT LONG TERM GOAL #4   Title  Perform ADL's with pain not > 2/10.    Time  6    Period  Weeks    Status  New            Plan - 12/30/18 0907    Clinical Impression Statement  Pt arrived today doing fairly well, but with some RT hip soreness. He was able to complete all therex for strengthening and balance with mainly fatigue.Improved quad control now with [facility] and [facility] exs.    Examination-Activity Limitations  Locomotion Level    Stability/Clinical Decision Making  Stable/Uncomplicated    Rehab Potential  Excellent    PT Frequency  2x / week    PT Duration  6 weeks    PT Treatment/Interventions  ADLs/Self Care Home Management;Cryotherapy;Electrical Stimulation;Gait training;Moist Heat;Functional mobility training;Therapeutic activities;Therapeutic exercise;Patient/family education;Passive range of motion;Manual techniques    PT Next Visit Plan  Please do FOTO intake next visit.  Nustep, gait training activities, PRE's without pain increase.  Modalites as needed.    Consulted and Agree with Plan of Care  Patient       Patient will benefit from skilled therapeutic intervention in order to improve the following deficits and impairments:  Abnormal gait, Pain, Decreased activity tolerance, Decreased strength  Visit Diagnosis: Pain in right hip  Other abnormalities of gait and mobility     Problem List There are no active problems to display for this patient.   Marjon Doxtater,CHRIS, PTA 12/30/2018,  10:01 AM  [facility] [place], [place], [address] Phone: [phone]   Fax:  [phone]  Name: Lucas Erickson MRN: [id number] Date of Birth: September 28, 2000

## 2019-01-04 ENCOUNTER — Ambulatory Visit: Payer: Managed Care, Other (non HMO) | Admitting: Physical Therapy

## 2019-01-04 ENCOUNTER — Encounter: Payer: Self-pay | Admitting: Physical Therapy

## 2019-01-04 ENCOUNTER — Other Ambulatory Visit: Payer: Self-pay

## 2019-01-04 DIAGNOSIS — R2689 Other abnormalities of gait and mobility: Secondary | ICD-10-CM

## 2019-01-04 DIAGNOSIS — M25551 Pain in right hip: Secondary | ICD-10-CM | POA: Diagnosis not present

## 2019-01-04 NOTE — Therapy (Signed)
[facility] [place], [place], [address] Phone: [phone]   Fax:  [phone]  Physical Therapy Treatment  Patient Details  Name: Lucas Erickson MRN: [id number] Date of Birth: 02-17-2001 Referring Provider (PT): Cynthis Beatriz Chancellor MD   Encounter Date: 01/04/2019  PT End of Session - 01/04/19 0743    Visit Number  6    Number of Visits  12    Date for PT Re-Evaluation  01/26/19    Authorization Type  PROGRESS NOTE AT 10TH VISIT.    PT Start Time  [phone]    PT Stop Time  0815    PT Time Calculation (min)  44 min    Activity Tolerance  Patient tolerated treatment well    Behavior During Therapy  [facility] for tasks assessed/performed       Past Medical History:  Diagnosis Date  . Asthma 2011    History reviewed. No pertinent surgical history.  There were no vitals filed for this visit.  Subjective Assessment - 01/04/19 0737    Subjective  COVID19 screening performed prior to entering the building. Patient arrives feeling tired and sore after last visit but overall feeling good.    Patient Stated Goals  "Be able to walk like normal again with minimal pain."    Currently in Pain?  No/denies         [facility] PT Assessment - 01/04/19 0001      Assessment   Medical Diagnosis  Neoplasm of unspecified behavior of bone, soft tissue and skin.    Referring Provider (PT)  Cynthis Beatriz Chancellor MD      Strength   Strength Assessment Site  Hip    Right/Left Hip  Right    Right Hip Flexion  4/5    Right Hip Extension  4-/5    Right Hip ABduction  4/5    Right Hip ADduction  4/5                   [facility] Adult PT Treatment/Exercise - 01/04/19 0001      Exercises   Exercises  Knee/Hip      Knee/Hip Exercises: Aerobic   Recumbent Bike  L5 x 10 mins    Stepper  ramp 10, R5 x 5 mins      Knee/Hip Exercises: Standing   Rocker Board  3 minutes   balance with ball toss   Other Standing Knee Exercises  inverted BOSU with  mini squats x 3x10    Other Standing Knee Exercises  forward and backward resisted walking orange XTS x10 each; lateral stepping and monster walks red theraband x2 down carpeted hall way      Knee/Hip Exercises: Seated   Long Arc Quad  Right;Strengthening;3 sets;10 reps    Long Arc Quad Weight  4 lbs.      Knee/Hip Exercises: Sidelying   Clams  red theraband x20 right                  PT Long Term Goals - 01/04/19 0753      PT LONG TERM GOAL #1   Title  Independent with a HEP.    Time  6    Period  Weeks    Status  Achieved      PT LONG TERM GOAL #2   Title  Increase right hip strength to a solid 5/5 to increase stability for functional activties.    Time  6    Period  Weeks  Status  New      PT LONG TERM GOAL #3   Title  Walk a community distance without assistive device and no pain.    Time  6    Period  Weeks    Status  On-going      PT LONG TERM GOAL #4   Title  Perform ADL's with pain not > 2/10.    Time  6    Period  Weeks    Status  Achieved            Plan - 01/04/19 0804    Clinical Impression Statement  Patient was able to tolerate treatment well but with ongoing bilateral LE soreness but no pain. Patient completed all exercises with good form after demonstration. Patient noted with minimal LE fasciculations throughout TEs. HEP and ADL goals have been met, strength goal ongoing at this time.    Examination-Activity Limitations  Locomotion Level    Stability/Clinical Decision Making  Stable/Uncomplicated    Clinical Decision Making  Moderate    Rehab Potential  Excellent    PT Frequency  2x / week    PT Duration  6 weeks    PT Treatment/Interventions  ADLs/Self Care Home Management;Cryotherapy;Electrical Stimulation;Gait training;Moist Heat;Functional mobility training;Therapeutic activities;Therapeutic exercise;Patient/family education;Passive range of motion;Manual techniques    PT Next Visit Plan  progress flexion and extension hip  exercises, Nustep, gait training activities, PRE's without pain increase.  Modalites as needed.    Consulted and Agree with Plan of Care  Patient       Patient will benefit from skilled therapeutic intervention in order to improve the following deficits and impairments:  Abnormal gait, Pain, Decreased activity tolerance, Decreased strength  Visit Diagnosis: Pain in right hip  Other abnormalities of gait and mobility     Problem List There are no active problems to display for this patient.   Gabriela Eves, PT, DPT 01/04/2019, 8:36 AM  [facility] [place], [place], [address] Phone: [phone]   Fax:  [phone]  Name: Lucas Erickson MRN: [id number] Date of Birth: 2001/03/19

## 2019-01-06 ENCOUNTER — Ambulatory Visit: Payer: Managed Care, Other (non HMO) | Admitting: Physical Therapy

## 2019-01-06 ENCOUNTER — Other Ambulatory Visit: Payer: Self-pay

## 2019-01-06 DIAGNOSIS — R2689 Other abnormalities of gait and mobility: Secondary | ICD-10-CM

## 2019-01-06 DIAGNOSIS — M25551 Pain in right hip: Secondary | ICD-10-CM | POA: Diagnosis not present

## 2019-01-06 NOTE — Therapy (Signed)
[facility] [place], [place], [address] Phone: [phone]   Fax:  [phone]  Physical Therapy Treatment  Patient Details  Name: IZICK GASBARRO MRN: [id number] Date of Birth: [birth year] Referring Provider (PT): Cynthis Beatriz Chancellor MD   Encounter Date: 01/06/2019  PT End of Session - 01/06/19 0835    Visit Number  7    Number of Visits  12    Date for PT Re-Evaluation  01/26/19    Authorization Type  PROGRESS NOTE AT 10TH VISIT.    PT Start Time  0815    PT Stop Time  0857    PT Time Calculation (min)  42 min    Activity Tolerance  Patient tolerated treatment well    Behavior During Therapy  WFL for tasks assessed/performed       Past Medical History:  Diagnosis Date  . Asthma 2011    No past surgical history on file.  There were no vitals filed for this visit.  Subjective Assessment - 01/06/19 0834    Subjective  COVID19 screening performed prior to entering the building. Patient reported feeling good.    Patient Stated Goals  "Be able to walk like normal again with minimal pain."    Currently in Pain?  No/denies    Pain Onset  More than a month ago    Pain Frequency  Intermittent         [facility] PT Assessment - 01/06/19 0001      Assessment   Medical Diagnosis  Neoplasm of unspecified behavior of bone, soft tissue and skin.    Referring Provider (PT)  Cynthis Beatriz Chancellor MD                   [facility] Adult PT Treatment/Exercise - 01/06/19 0001      Knee/Hip Exercises: Aerobic   Recumbent Bike  L5 x 10 mins    Stepper  ramp 10, R5 3' fwd, 3' bwd, x6 mins      Knee/Hip Exercises: Standing   Wall Squat  1 set;15 reps;3 seconds    Lunge Walking - Round Trips  x1 round trip carpeted hall Environmental manager  5 minutes   lateral   Other Standing Knee Exercises  inverted BOSU with mini squats x 3x10    Other Standing Knee Exercises  forward and backward resisted walking orange XTS x10 each;  lateral stepping red theraband x2 down carpeted hall way                  PT Long Term Goals - 01/04/19 0753      PT LONG TERM GOAL #1   Title  Independent with a HEP.    Time  6    Period  Weeks    Status  Achieved      PT LONG TERM GOAL #2   Title  Increase right hip strength to a solid 5/5 to increase stability for functional activties.    Time  6    Period  Weeks    Status  New      PT LONG TERM GOAL #3   Title  Walk a community distance without assistive device and no pain.    Time  6    Period  Weeks    Status  On-going      PT LONG TERM GOAL #4   Title  Perform ADL's with pain not > 2/10.    Time  6  Period  Weeks    Status  Achieved            Plan - 01/06/19 0858    Clinical Impression Statement  Patient responded well to therapy session with progression of dynamic strengthening and balance exercises. Patient noted with good form with lunges but with muscle fasciculations particularly with right LE.    Examination-Activity Limitations  Locomotion Level    Stability/Clinical Decision Making  Stable/Uncomplicated    Clinical Decision Making  Moderate    Rehab Potential  Excellent    PT Frequency  2x / week    PT Duration  6 weeks    PT Treatment/Interventions  ADLs/Self Care Home Management;Cryotherapy;Electrical Stimulation;Gait training;Moist Heat;Functional mobility training;Therapeutic activities;Therapeutic exercise;Patient/family education;Passive range of motion;Manual techniques    PT Next Visit Plan  progress flexion and extension hip exercises, Nustep, gait training activities, PRE's without pain increase.  Modalites as needed.    Consulted and Agree with Plan of Care  Patient       Patient will benefit from skilled therapeutic intervention in order to improve the following deficits and impairments:  Abnormal gait, Pain, Decreased activity tolerance, Decreased strength  Visit Diagnosis: Pain in right hip  Other abnormalities of  gait and mobility     Problem List There are no active problems to display for this patient.   Guss BundeKrystle Jeanann Balinski, PT, DPT 01/06/2019, 9:57 AM  [facility] [address] [place], [place], [address] Phone: [phone]   Fax:  [phone]  Name: Leeroy BockCody S Aeschliman MRN: [id number] Date of Birth: 04-13-2001

## 2019-01-10 ENCOUNTER — Ambulatory Visit: Payer: Managed Care, Other (non HMO) | Admitting: Physical Therapy

## 2019-01-10 ENCOUNTER — Encounter: Payer: Self-pay | Admitting: Physical Therapy

## 2019-01-10 ENCOUNTER — Other Ambulatory Visit: Payer: Self-pay

## 2019-01-10 DIAGNOSIS — R2689 Other abnormalities of gait and mobility: Secondary | ICD-10-CM

## 2019-01-10 DIAGNOSIS — M25551 Pain in right hip: Secondary | ICD-10-CM

## 2019-01-10 NOTE — Therapy (Signed)
[facility] [address] [place], [place], [address] Phone: [phone]   Fax:  [phone]  Physical Therapy Treatment  Patient Details  Name: Lucas Erickson MRN: [id number] Date of Birth: 10/13/2000 Referring Provider (PT): Cynthis Amaryllis Dyke MD   Encounter Date: 01/10/2019  PT End of Session - 01/10/19 0739    Visit Number  8    Number of Visits  12    Date for PT Re-Evaluation  01/26/19    Authorization Type  PROGRESS NOTE AT 10TH VISIT.    PT Start Time  0735    PT Stop Time  0817    PT Time Calculation (min)  42 min    Activity Tolerance  Patient tolerated treatment well    Behavior During Therapy  [facility] for tasks assessed/performed       Past Medical History:  Diagnosis Date  . Asthma 2011    History reviewed. No pertinent surgical history.  There were no vitals filed for this visit.  Subjective Assessment - 01/10/19 0734    Subjective  COVID19 screening performed prior to entering the building. Patient reports his hip feeling tired but no pain.    Patient Stated Goals  "Be able to walk like normal again with minimal pain."    Currently in Pain?  No/denies         [facility] PT Assessment - 01/10/19 0001      Assessment   Medical Diagnosis  Neoplasm of unspecified behavior of bone, soft tissue and skin.    Referring Provider (PT)  Cynthis Amaryllis Dyke MD    Onset Date/Surgical Date  09/16/18    Next MD Visit  03/01/2019                   [facility] Adult PT Treatment/Exercise - 01/10/19 0001      Knee/Hip Exercises: Aerobic   Recumbent Bike  L4 x10 min    Stepper  L3, R3 x10 min      Knee/Hip Exercises: Machines for Strengthening   Cybex Leg Press  2 pl, seat 7, 2x10 reps      Knee/Hip Exercises: Standing   Wall Squat  15 reps;5 seconds    Lunge Walking - Round Trips  x3 RT foward and lateral with green theraband    Other Standing Knee Exercises  R SL squat to plinth x10 reps      Knee/Hip Exercises:  Supine   Bridges  Strengthening;15 reps   with feet on small red ball   Bridges with [employer]  Strengthening;2 sets;10 reps   6# ball squeeze   Straight Leg Raises  AROM;Right;2 sets;10 reps    Straight Leg Raise with External Rotation  AROM;Right;2 sets;10 reps      Knee/Hip Exercises: Sidelying   Hip ABduction  AROM;Right;2 sets;10 reps                  PT Long Term Goals - 01/10/19 3267      PT LONG TERM GOAL #1   Title  Independent with a HEP.    Time  6    Period  Weeks    Status  Achieved      PT LONG TERM GOAL #2   Title  Increase right hip strength to a solid 5/5 to increase stability for functional activties.    Time  6    Period  Weeks    Status  On-going      PT LONG TERM GOAL #3  Title  Walk a community distance without assistive device and no pain.    Time  6    Period  Weeks    Status  Achieved      PT LONG TERM GOAL #4   Title  Perform ADL's with pain not > 2/10.    Time  6    Period  Weeks    Status  Achieved            Plan - 01/10/19 0914    Clinical Impression Statement  Patient presented in clinic with no complaints of any R hip pain. Patient did report soreness of R hip intermittantly. He is aware of his restrictions to not stand more than 2 hours and his work is compliant with allowing him to rest. Patient able to tolerate all therex today with only complaints of soreness. Weakness more with SLRs and hip abduction per patient report. No pain with ambulation per patient report.    Examination-Activity Limitations  Locomotion Level    Stability/Clinical Decision Making  Stable/Uncomplicated    Rehab Potential  Excellent    PT Frequency  2x / week    PT Duration  6 weeks    PT Treatment/Interventions  ADLs/Self Care Home Management;Cryotherapy;Electrical Stimulation;Gait training;Moist Heat;Functional mobility training;Therapeutic activities;Therapeutic exercise;Patient/family education;Passive range of motion;Manual techniques     PT Next Visit Plan  progress flexion and extension hip exercises, Nustep, gait training activities, PRE's without pain increase.  Modalites as needed.    Consulted and Agree with Plan of Care  Patient       Patient will benefit from skilled therapeutic intervention in order to improve the following deficits and impairments:  Abnormal gait, Pain, Decreased activity tolerance, Decreased strength  Visit Diagnosis: Pain in right hip  Other abnormalities of gait and mobility     Problem List There are no active problems to display for this patient.   Lucas Erickson, PTA 01/10/2019, 9:38 AM  [facility] [address] [place], [place], [address] Phone: [phone]   Fax:  [phone]  Name: Lucas Erickson MRN: [id number] Date of Birth: [birth year]

## 2019-01-13 ENCOUNTER — Ambulatory Visit: Payer: Managed Care, Other (non HMO) | Admitting: *Deleted

## 2019-01-13 ENCOUNTER — Other Ambulatory Visit: Payer: Self-pay

## 2019-01-13 DIAGNOSIS — M25551 Pain in right hip: Secondary | ICD-10-CM | POA: Diagnosis not present

## 2019-01-13 DIAGNOSIS — R2689 Other abnormalities of gait and mobility: Secondary | ICD-10-CM

## 2019-01-13 NOTE — Therapy (Signed)
[facility] [address] [place], [place], [address] Phone: [phone]   Fax:  [phone]  Physical Therapy Treatment  Patient Details  Name: ALVARO AUNGST MRN: [id number] Date of Birth: Dec 28, 2000 Referring Provider (PT): Cynthis Amaryllis Dyke MD   Encounter Date: 01/13/2019  PT End of Session - 01/13/19 0924    Visit Number  9    Number of Visits  12    Date for PT Re-Evaluation  01/26/19    PT Start Time  0900    PT Stop Time  0950    PT Time Calculation (min)  50 min       Past Medical History:  Diagnosis Date  . Asthma 2011    No past surgical history on file.  There were no vitals filed for this visit.  Subjective Assessment - 01/13/19 0901    Subjective  COVID19 screening performed prior to entering the building. Patient reports his hip feeling tired and a little soreness .  MD F/U not until Nov.  Get more visits?    Patient Stated Goals  "Be able to walk like normal again with minimal pain."    Currently in Pain?  No/denies    Pain Score  1     Pain Location  Buttocks    Pain Descriptors / Indicators  Sore                       [facility] Adult PT Treatment/Exercise - 01/13/19 0001      Knee/Hip Exercises: Aerobic   Elliptical  L5, R5 x 10 mins    Recumbent Bike  L4 x10 min      Knee/Hip Exercises: Machines for Strengthening   Cybex Leg Press  2 pl, seat 7, 3x10-15 reps      Knee/Hip Exercises: Standing   Wall Squat  5 seconds;10 reps    Lunge Walking - Round Trips  x3 RT foward and lateral with green theraband    SLS  on floor and airex balance pad x 5 mins total      Knee/Hip Exercises: Supine   Bridges with Ball Squeeze  Strengthening;10 reps;1 set;Both   Hold 10 secs     Knee/Hip Exercises: Sidelying   Hip ABduction  --                  PT Long Term Goals - 01/10/19 0737      PT LONG TERM GOAL #1   Title  Independent with a HEP.    Time  6    Period  Weeks    Status   Achieved      PT LONG TERM GOAL #2   Title  Increase right hip strength to a solid 5/5 to increase stability for functional activties.    Time  6    Period  Weeks    Status  On-going      PT LONG TERM GOAL #3   Title  Walk a community distance without assistive device and no pain.    Time  6    Period  Weeks    Status  Achieved      PT LONG TERM GOAL #4   Title  Perform ADL's with pain not > 2/10.    Time  6    Period  Weeks    Status  Achieved            Plan - 01/13/19 0929    Clinical Impression Statement  Pt arrived today doing fairly well with only mild pain RT hip/glute. He was able to complete all therex and balancing act.'s with mainly fatigue and mild glute pain.    Examination-Activity Limitations  Locomotion Level    Stability/Clinical Decision Making  Stable/Uncomplicated    Rehab Potential  Excellent    PT Frequency  2x / week    PT Duration  6 weeks    PT Treatment/Interventions  ADLs/Self Care Home Management;Cryotherapy;Electrical Stimulation;Gait training;Moist Heat;Functional mobility training;Therapeutic activities;Therapeutic exercise;Patient/family education;Passive range of motion;Manual techniques    PT Next Visit Plan  progress flexion and extension hip exercises, Nustep, gait training activities, PRE's without pain increase.  Modalites as needed.  Recert soon for more visits?    Consulted and Agree with Plan of Care  Patient       Patient will benefit from skilled therapeutic intervention in order to improve the following deficits and impairments:  Abnormal gait, Pain, Decreased activity tolerance, Decreased strength  Visit Diagnosis: Pain in right hip  Other abnormalities of gait and mobility     Problem List There are no active problems to display for this patient.   Whitnee Orzel,CHRIS, PTA 01/13/2019, 12:48 PM  [facility] [address] [place], [place], [address] Phone: [phone]   Fax:   [phone]  Name: ETHON WYMER MRN: [id number] Date of Birth: 2000/05/16

## 2019-01-17 ENCOUNTER — Ambulatory Visit: Payer: Managed Care, Other (non HMO) | Admitting: Physical Therapy

## 2019-01-17 ENCOUNTER — Encounter: Payer: Self-pay | Admitting: Physical Therapy

## 2019-01-17 ENCOUNTER — Other Ambulatory Visit: Payer: Self-pay

## 2019-01-17 DIAGNOSIS — M25551 Pain in right hip: Secondary | ICD-10-CM | POA: Diagnosis not present

## 2019-01-17 DIAGNOSIS — R2689 Other abnormalities of gait and mobility: Secondary | ICD-10-CM

## 2019-01-17 NOTE — Therapy (Signed)
[facility] [place], [place], [address] Phone: [phone]   Fax:  [phone]  Physical Therapy Treatment   Progress Note Reporting Period 12/15/2018 to 01/17/2019  See note below for Objective Data and Assessment of Progress/Goals. Patient is progressing well but is still limited with R LE MMT. Gabriela Eves, PT, DPT       Patient Details  Name: Lucas Erickson MRN: [id number] Date of Birth: 24-Nov-2000 Referring Provider (PT): Cynthis Beatriz Chancellor MD   Encounter Date: 01/17/2019  PT End of Session - 01/17/19 0747    Visit Number  10    Number of Visits  20    Date for PT Re-Evaluation  03/17/19    Authorization Type  PROGRESS NOTE AT 10TH VISIT.    PT Start Time  0730    PT Stop Time  0818    PT Time Calculation (min)  48 min    Activity Tolerance  Patient tolerated treatment well    Behavior During Therapy  WFL for tasks assessed/performed       Past Medical History:  Diagnosis Date  . Asthma 2011    History reviewed. No pertinent surgical history.  There were no vitals filed for this visit.  Subjective Assessment - 01/17/19 0744    Subjective  COVID19 screening performed prior to entering the building. Patient reported feeling good but reports ongoing fatigue and feels like his strength isn't quite there yet.    Patient Stated Goals  "Be able to walk like normal again with minimal pain."    Currently in Pain?  No/denies         [facility] PT Assessment - 01/17/19 0001      Assessment   Medical Diagnosis  Neoplasm of unspecified behavior of bone, soft tissue and skin.    Referring Provider (PT)  Cynthis Beatriz Chancellor MD    Onset Date/Surgical Date  09/16/18    Next MD Visit  03/01/2019                   [facility] Adult PT Treatment/Exercise - 01/17/19 0001      Knee/Hip Exercises: Aerobic   Elliptical  L5, R5 x 10 mins 5 fwd, 5 bwd    Recumbent Bike  L4 x10 min      Knee/Hip Exercises:  Machines for Strengthening   Cybex Leg Press  2 pl, seat 7, 3x10-15 reps      Knee/Hip Exercises: Standing   Lunge Walking - Round Trips  x2 green theraband carpeted hall way    SLS  Y colored pod touches x5    Walking with Sports Cord  lateral stepping green theraband x2 round trips carpeted hall way    Other Standing Knee Exercises  R SL squat to plinth 3x10 reps      Knee/Hip Exercises: Supine   Bridges  Strengthening;15 reps;Other (comment)   on red theraball                 PT Long Term Goals - 01/17/19 0747      PT LONG TERM GOAL #1   Title  Independent with a HEP.    Time  6    Period  Weeks    Status  Achieved      PT LONG TERM GOAL #2   Title  Increase right hip strength to a solid 5/5 to increase stability for functional activties.    Time  6    Period  Weeks  Status  On-going      PT LONG TERM GOAL #3   Title  Walk a community distance without assistive device and no pain.    Time  6    Period  Weeks    Status  Achieved      PT LONG TERM GOAL #4   Title  Perform ADL's with pain not > 2/10.    Time  6    Period  Weeks    Status  Achieved            Plan - 01/17/19 7628    Clinical Impression Statement  Patient responded well to therapy with minimal right hip pain, just muscle fatigue. Patient was able to demonstrate good form but noted with slight loss of balance with around the world lunges but no falls. Patient's stength goal is ongoing at this time.    Examination-Activity Limitations  Locomotion Level    Stability/Clinical Decision Making  Stable/Uncomplicated    Clinical Decision Making  Moderate    Rehab Potential  Excellent    PT Frequency  2x / week    PT Duration  6 weeks    PT Treatment/Interventions  ADLs/Self Care Home Management;Cryotherapy;Electrical Stimulation;Gait training;Moist Heat;Functional mobility training;Therapeutic activities;Therapeutic exercise;Patient/family education;Passive range of motion;Manual techniques     PT Next Visit Plan  progress flexion and extension hip exercises, Nustep, gait training activities, PRE's without pain increase.  Modalites as needed.  Re-cert sent for an additional 8 visits.    Consulted and Agree with Plan of Care  Patient       Patient will benefit from skilled therapeutic intervention in order to improve the following deficits and impairments:  Abnormal gait, Pain, Decreased activity tolerance, Decreased strength  Visit Diagnosis: Pain in right hip  Other abnormalities of gait and mobility     Problem List There are no active problems to display for this patient.   Guss Bunde, PT, DPT 01/17/2019, 9:24 AM  [facility] [address] [place], [place], [address] Phone: [phone]   Fax:  [phone]  Name: Lucas Erickson MRN: [id number] Date of Birth: 2000-11-11

## 2019-01-20 ENCOUNTER — Encounter: Payer: Self-pay | Admitting: *Deleted

## 2019-01-20 ENCOUNTER — Ambulatory Visit: Payer: Managed Care, Other (non HMO) | Attending: Orthopedic Surgery | Admitting: *Deleted

## 2019-01-20 ENCOUNTER — Other Ambulatory Visit: Payer: Self-pay

## 2019-01-20 DIAGNOSIS — R2689 Other abnormalities of gait and mobility: Secondary | ICD-10-CM | POA: Diagnosis present

## 2019-01-20 DIAGNOSIS — M25551 Pain in right hip: Secondary | ICD-10-CM | POA: Insufficient documentation

## 2019-01-20 NOTE — Therapy (Signed)
[facility] [place], [place], [address] Phone: [phone]   Fax:  [phone]  Physical Therapy Treatment  Patient Details  Name: Lucas Erickson MRN: [id number] Date of Birth: February 21, 2001 Referring Provider (PT): Cynthis Beatriz Chancellor MD   Encounter Date: 01/20/2019  PT End of Session - 01/20/19 0906    Visit Number  11    Number of Visits  20    Date for PT Re-Evaluation  03/17/19    Authorization Type  PROGRESS NOTE AT 10TH VISIT.    PT Start Time  0900    PT Stop Time  0947    PT Time Calculation (min)  47 min       Past Medical History:  Diagnosis Date  . Asthma 2011    History reviewed. No pertinent surgical history.  There were no vitals filed for this visit.  Subjective Assessment - 01/20/19 0927    Subjective  COVID19 screening performed prior to entering the building. A little soreness in the AM    Patient Stated Goals  "Be able to walk like normal again with minimal pain."    Currently in Pain?  Yes    Pain Score  2     Pain Location  Buttocks    Pain Orientation  Right    Pain Type  Surgical pain    Pain Onset  More than a month ago                       [facility] Adult PT Treatment/Exercise - 01/20/19 0001      Knee/Hip Exercises: Aerobic   Elliptical  L5, R5 x 10 mins 5 fwd, 5 bwd    Recumbent Bike  L4 x10 min      Knee/Hip Exercises: Machines for Strengthening   Cybex Leg Press  2 pl, seat 7, 3x10-15 reps      Knee/Hip Exercises: Standing   Lunge Walking - Round Trips  x2 green theraband carpeted hall way    SLS  Y colored pod touches x5    Walking with Sports Cord  lateral stepping green theraband x2 round trips carpeted hall way    Other Standing Knee Exercises  R SL squat to plinth 3x10 reps                  PT Long Term Goals - 01/17/19 0747      PT LONG TERM GOAL #1   Title  Independent with a HEP.    Time  6    Period  Weeks    Status  Achieved      PT  LONG TERM GOAL #2   Title  Increase right hip strength to a solid 5/5 to increase stability for functional activties.    Time  6    Period  Weeks    Status  On-going      PT LONG TERM GOAL #3   Title  Walk a community distance without assistive device and no pain.    Time  6    Period  Weeks    Status  Achieved      PT LONG TERM GOAL #4   Title  Perform ADL's with pain not > 2/10.    Time  6    Period  Weeks    Status  Achieved            Plan - 01/20/19 0945    Clinical Impression Statement  Patient  arrived today doing fairly well except for some RT buttocks soreness. He was able to complete all strengthening exs with mainly fatigue and soreness. Balance continues to improve.    Examination-Activity Limitations  Locomotion Level    Stability/Clinical Decision Making  Stable/Uncomplicated    Rehab Potential  Excellent    PT Frequency  2x / week    PT Treatment/Interventions  ADLs/Self Care Home Management;Cryotherapy;Electrical Stimulation;Gait training;Moist Heat;Functional mobility training;Therapeutic activities;Therapeutic exercise;Patient/family education;Passive range of motion;Manual techniques    PT Next Visit Plan  progress flexion and extension hip exercises, Nustep, gait training activities, PRE's without pain increase.  Modalites as needed.  Re-cert sent for an additional 8 visits.    Consulted and Agree with Plan of Care  Patient       Patient will benefit from skilled therapeutic intervention in order to improve the following deficits and impairments:  Abnormal gait, Pain, Decreased activity tolerance, Decreased strength  Visit Diagnosis: Pain in right hip  Other abnormalities of gait and mobility     Problem List There are no active problems to display for this patient.   ,CHRIS, PTA 01/20/2019, 10:08 AM  [facility] [address] [place], [place], [address] Phone: [phone]   Fax:   [phone]  Name: TRINTEN BOUDOIN MRN: [id number] Date of Birth: [birth year]

## 2019-01-24 ENCOUNTER — Other Ambulatory Visit: Payer: Self-pay

## 2019-01-24 ENCOUNTER — Ambulatory Visit: Payer: Managed Care, Other (non HMO) | Admitting: Physical Therapy

## 2019-01-24 ENCOUNTER — Encounter: Payer: Self-pay | Admitting: Physical Therapy

## 2019-01-24 DIAGNOSIS — R2689 Other abnormalities of gait and mobility: Secondary | ICD-10-CM

## 2019-01-24 DIAGNOSIS — M25551 Pain in right hip: Secondary | ICD-10-CM | POA: Diagnosis not present

## 2019-01-24 NOTE — Therapy (Signed)
[facility] [address] [place], [place], [address] Phone: [phone]   Fax:  [phone]  Physical Therapy Treatment  Patient Details  Name: Lucas Erickson MRN: [id number] Date of Birth: Dec 19, 2000 Referring Provider (PT): Cynthis Amaryllis Dyke MD   Encounter Date: 01/24/2019  PT End of Session - 01/24/19 0751    Visit Number  12    Number of Visits  20    Date for PT Re-Evaluation  03/17/19    Authorization Type  PROGRESS NOTE AT 10TH VISIT.    PT Start Time  [id number]    PT Stop Time  0814    PT Time Calculation (min)  41 min    Activity Tolerance  Patient tolerated treatment well    Behavior During Therapy  [facility] for tasks assessed/performed       Past Medical History:  Diagnosis Date  . Asthma 2011    History reviewed. No pertinent surgical history.  There were no vitals filed for this visit.  Subjective Assessment - 01/24/19 0748    Subjective  COVID19 screening performed prior to entering the building. A little soreness in the AM    Patient Stated Goals  "Be able to walk like normal again with minimal pain."    Currently in Pain?  Yes    Pain Score  --   No pain score provided   Pain Location  Hip    Pain Orientation  Right    Pain Descriptors / Indicators  Sore    Pain Type  Surgical pain    Pain Onset  More than a month ago    Pain Frequency  Intermittent         [facility] PT Assessment - 01/24/19 0001      Assessment   Medical Diagnosis  Neoplasm of unspecified behavior of bone, soft tissue and skin.    Referring Provider (PT)  Cynthis Amaryllis Dyke MD    Onset Date/Surgical Date  09/16/18    Next MD Visit  03/01/2019                   [facility] Adult PT Treatment/Exercise - 01/24/19 0001      Knee/Hip Exercises: Aerobic   Elliptical  L4, R4 x 10 mins 5 fwd, 5 bwd    Recumbent Bike  L5 x10 min      Knee/Hip Exercises: Machines for Strengthening   Cybex Leg Press  2.5 pl, seat7 2x15 reps      Knee/Hip  Exercises: Standing   Walking with Sports Cord  forward, backwards, B lateral stepping orange XTS x10 reps each    Other Standing Knee Exercises  R bulgarian split squat 5# 2x10 reps      Knee/Hip Exercises: Seated   Sit to Sand  20 reps;without UE support   RLE only 5#      Knee/Hip Exercises: Supine   Bridges with Ball Squeeze  Strengthening;Both;2 sets;10 reps   6# ball squeeze   Straight Leg Raises  AROM;Right;2 sets;10 reps    Straight Leg Raise with External Rotation  AROM;Right;2 sets;10 reps      Knee/Hip Exercises: Sidelying   Hip ABduction  AROM;Right;2 sets;10 reps      Knee/Hip Exercises: Prone   Hip Extension  AROM;Right;2 sets;10 reps                  PT Long Term Goals - 01/17/19 0747      PT LONG TERM GOAL #1   Title  Independent with a HEP.    Time  6    Period  Weeks    Status  Achieved      PT LONG TERM GOAL #2   Title  Increase right hip strength to a solid 5/5 to increase stability for functional activties.    Time  6    Period  Weeks    Status  On-going      PT LONG TERM GOAL #3   Title  Walk a community distance without assistive device and no pain.    Time  6    Period  Weeks    Status  Achieved      PT LONG TERM GOAL #4   Title  Perform ADL's with pain not > 2/10.    Time  6    Period  Weeks    Status  Achieved            Plan - 01/24/19 0820    Clinical Impression Statement  Patient presented in clinic with reports of only soreness in the mornings. Patient not limited by R hip with work activities or with any ADLs within home enviroment. Patient guided through more resisted exercises today with no complaints of pain. Patient demonstrated greater fatigue with exercises in sidelying and supine today.    Examination-Activity Limitations  Locomotion Level    Stability/Clinical Decision Making  Stable/Uncomplicated    Rehab Potential  Excellent    PT Frequency  2x / week    PT Duration  6 weeks    PT Treatment/Interventions   ADLs/Self Care Home Management;Cryotherapy;Electrical Stimulation;Gait training;Moist Heat;Functional mobility training;Therapeutic activities;Therapeutic exercise;Patient/family education;Passive range of motion;Manual techniques    PT Next Visit Plan  progress flexion and extension hip exercises, Nustep, gait training activities, PRE's without pain increase.  Modalites as needed.  Re-cert sent for an additional 8 visits.    Consulted and Agree with Plan of Care  Patient       Patient will benefit from skilled therapeutic intervention in order to improve the following deficits and impairments:  Abnormal gait, Pain, Decreased activity tolerance, Decreased strength  Visit Diagnosis: Pain in right hip  Other abnormalities of gait and mobility     Problem List There are no active problems to display for this patient.   Standley Brooking, PTA 01/24/2019, 8:27 AM  [facility] [address] [place], [place], [address] Phone: [phone]   Fax:  [phone]  Name: TRASK VOSLER MRN: [id number] Date of Birth: May 22, 2000

## 2019-01-27 ENCOUNTER — Encounter: Payer: Self-pay | Admitting: Physical Therapy

## 2019-01-27 ENCOUNTER — Ambulatory Visit: Payer: Managed Care, Other (non HMO) | Admitting: Physical Therapy

## 2019-01-27 ENCOUNTER — Other Ambulatory Visit: Payer: Self-pay

## 2019-01-27 DIAGNOSIS — R2689 Other abnormalities of gait and mobility: Secondary | ICD-10-CM

## 2019-01-27 DIAGNOSIS — M25551 Pain in right hip: Secondary | ICD-10-CM

## 2019-01-27 NOTE — Therapy (Signed)
[facility] [place], [place], [address] Phone: [phone]   Fax:  [phone]  Physical Therapy Treatment  Patient Details  Name: Lucas Erickson MRN: [id number] Date of Birth: Aug 12, 2000 Referring Provider (PT): Cynthis Beatriz Chancellor MD   Encounter Date: 01/27/2019  PT End of Session - 01/27/19 0947    Visit Number  13    Number of Visits  20    Date for PT Re-Evaluation  03/17/19    Authorization Type  PROGRESS NOTE AT 10TH VISIT.    PT Start Time  0900    PT Stop Time  0948    PT Time Calculation (min)  48 min    Activity Tolerance  Patient tolerated treatment well    Behavior During Therapy  [facility] for tasks assessed/performed       Past Medical History:  Diagnosis Date  . Asthma 2011    History reviewed. No pertinent surgical history.  There were no vitals filed for this visit.  Subjective Assessment - 01/27/19 0946    Subjective  COVID19 screening performed prior to entering the building. Patient reports feeling sore after a fall at work the other day.    Patient Stated Goals  "Be able to walk like normal again with minimal pain."    Currently in Pain?  Yes   "a little"   Pain Location  Hip    Pain Orientation  Right    Pain Descriptors / Indicators  Sore         [facility] PT Assessment - 01/27/19 0001      Assessment   Medical Diagnosis  Neoplasm of unspecified behavior of bone, soft tissue and skin.    Referring Provider (PT)  Cynthis Beatriz Chancellor MD    Onset Date/Surgical Date  09/16/18    Next MD Visit  03/01/2019                   [facility] Adult PT Treatment/Exercise - 01/27/19 0001      Knee/Hip Exercises: Aerobic   Elliptical  L5, R5 x 10 mins 5 fwd, 5 bwd      Knee/Hip Exercises: Machines for Strengthening   Cybex Leg Press  2.5 pl, seat7 2x15 reps; 2 plates eccentric R  control 2x10      Knee/Hip Exercises: Standing   Lunge Walking - Round Trips  x2 green theraband carpeted hall way     Walking with Sports Cord  forward, backwards, orange XTS x20 reps each    Other Standing Knee Exercises  R bulgarian split squat 5# 2x10 reps; goblet squats 10# 2x10    Other Standing Knee Exercises  lateral stepping down carpeted hall way green resistance band x3      Knee/Hip Exercises: Supine   Bridges  Strengthening;20 reps    Bridges Limitations  2x10 bridges with hamstring curl                  PT Long Term Goals - 01/17/19 0747      PT LONG TERM GOAL #1   Title  Independent with a HEP.    Time  6    Period  Weeks    Status  Achieved      PT LONG TERM GOAL #2   Title  Increase right hip strength to a solid 5/5 to increase stability for functional activties.    Time  6    Period  Weeks    Status  On-going  PT LONG TERM GOAL #3   Title  Walk a community distance without assistive device and no pain.    Time  6    Period  Weeks    Status  Achieved      PT LONG TERM GOAL #4   Title  Perform ADL's with pain not > 2/10.    Time  6    Period  Weeks    Status  Achieved            Plan - 01/27/19 0947    Clinical Impression Statement  Patient responded well to therapy with just reports of muscle fatigue and soreness after his fall at work. Patient was able to demonstrate good form with all new TEs after cuing for form.    Examination-Activity Limitations  Locomotion Level    Stability/Clinical Decision Making  Stable/Uncomplicated    Clinical Decision Making  Moderate    Rehab Potential  Excellent    PT Frequency  2x / week    PT Duration  6 weeks    PT Treatment/Interventions  ADLs/Self Care Home Management;Cryotherapy;Electrical Stimulation;Gait training;Moist Heat;Functional mobility training;Therapeutic activities;Therapeutic exercise;Patient/family education;Passive range of motion;Manual techniques    PT Next Visit Plan  progress flexion and extension hip exercises, Nustep, gait training activities, PRE's without pain increase.  Modalites as  needed.    Consulted and Agree with Plan of Care  Patient       Patient will benefit from skilled therapeutic intervention in order to improve the following deficits and impairments:  Abnormal gait, Pain, Decreased activity tolerance, Decreased strength  Visit Diagnosis: Pain in right hip  Other abnormalities of gait and mobility     Problem List There are no active problems to display for this patient.    Guss Bunde, PT, DPT 01/27/2019, 12:08 PM  [facility] [address] [place], [place], [address] Phone: [phone]   Fax:  [phone]  Name: Lucas Erickson MRN: [id number] Date of Birth: 14-Oct-2000

## 2019-01-31 ENCOUNTER — Other Ambulatory Visit: Payer: Self-pay

## 2019-01-31 ENCOUNTER — Encounter: Payer: Self-pay | Admitting: Physical Therapy

## 2019-01-31 ENCOUNTER — Ambulatory Visit: Payer: Managed Care, Other (non HMO) | Admitting: Physical Therapy

## 2019-01-31 DIAGNOSIS — M25551 Pain in right hip: Secondary | ICD-10-CM

## 2019-01-31 DIAGNOSIS — R2689 Other abnormalities of gait and mobility: Secondary | ICD-10-CM

## 2019-01-31 NOTE — Therapy (Signed)
[facility] [place], [place], [address] Phone: [phone]   Fax:  [phone]  Physical Therapy Treatment  Patient Details  Name: Lucas Erickson MRN: [id number] Date of Birth: 09-22-2000 Referring Provider (PT): Cynthis Beatriz Chancellor MD   Encounter Date: 01/31/2019  PT End of Session - 01/31/19 0736    Visit Number  14    Number of Visits  20    Date for PT Re-Evaluation  03/17/19    Authorization Type  PROGRESS NOTE AT 10TH VISIT.    PT Start Time  [phone]    PT Stop Time  0816    PT Time Calculation (min)  45 min    Activity Tolerance  Patient tolerated treatment well    Behavior During Therapy  [facility] for tasks assessed/performed       Past Medical History:  Diagnosis Date  . Asthma 2011    History reviewed. No pertinent surgical history.  There were no vitals filed for this visit.  Subjective Assessment - 01/31/19 0735    Subjective  COVID19 screening performed prior to entering the building. Patient reports feeling good but tired.    Patient Stated Goals  "Be able to walk like normal again with minimal pain."    Currently in Pain?  No/denies         [facility] PT Assessment - 01/31/19 0001      Assessment   Medical Diagnosis  Neoplasm of unspecified behavior of bone, soft tissue and skin.    Referring Provider (PT)  Cynthis Beatriz Chancellor MD    Onset Date/Surgical Date  09/16/18    Next MD Visit  03/01/2019                   [facility] Adult PT Treatment/Exercise - 01/31/19 0001      Knee/Hip Exercises: Aerobic   Elliptical  L5, R5 x 10 mins 5 fwd, 5 bwd      Knee/Hip Exercises: Machines for Strengthening   Cybex Knee Extension  30# 2x10    Cybex Knee Flexion  40# 2x10    Cybex Leg Press  2.5 pl, seat7 2x15 reps; 2 plates eccentric R  control 2x10      Knee/Hip Exercises: Standing   Lunge Walking - Round Trips  x3 green theraband carpeted hall way    Other Standing Knee Exercises  R bulgarian split  squat 5# 2x10 reps; sumo squats 10# 2x10    Other Standing Knee Exercises  lateral stepping down carpeted hall way green resistance band x3      Knee/Hip Exercises: Seated   Sit to Sand  20 reps;without UE support   5#     Knee/Hip Exercises: Supine   Bridges  --    Constance Haw Limitations  --    Constance Haw with Greig Right  Strengthening;Both;2 sets;10 reps   6# medicine ball   Single Leg Bridge  Strengthening;Both;2 sets;10 reps                  PT Long Term Goals - 01/17/19 0747      PT LONG TERM GOAL #1   Title  Independent with a HEP.    Time  6    Period  Weeks    Status  Achieved      PT LONG TERM GOAL #2   Title  Increase right hip strength to a solid 5/5 to increase stability for functional activties.    Time  6    Period  Weeks    Status  On-going      PT LONG TERM GOAL #3   Title  Walk a community distance without assistive device and no pain.    Time  6    Period  Weeks    Status  Achieved      PT LONG TERM GOAL #4   Title  Perform ADL's with pain not > 2/10.    Time  6    Period  Weeks    Status  Achieved            Plan - 01/31/19 3016    Clinical Impression Statement  Patient responded well to therapy session with no reports of pain, just muscle soreness due to fatigue. Patient required verbal cuing for proper form and knee/toe alignment for single leg squatsand demonstrated improved form for remainder of exercise. Patient instructed to continue HEP to address ongoing hip weakness.  Patient reported understanding.    Examination-Activity Limitations  Locomotion Level    Stability/Clinical Decision Making  Stable/Uncomplicated    Clinical Decision Making  Moderate    Rehab Potential  Excellent    PT Frequency  2x / week    PT Duration  6 weeks    PT Treatment/Interventions  ADLs/Self Care Home Management;Cryotherapy;Electrical Stimulation;Gait training;Moist Heat;Functional mobility training;Therapeutic activities;Therapeutic  exercise;Patient/family education;Passive range of motion;Manual techniques    PT Next Visit Plan  Assess goals; progress flexion and extension hip exercises, Nustep, gait training activities, PRE's without pain increase.  Modalites as needed.    Consulted and Agree with Plan of Care  Patient       Patient will benefit from skilled therapeutic intervention in order to improve the following deficits and impairments:  Abnormal gait, Pain, Decreased activity tolerance, Decreased strength  Visit Diagnosis: No diagnosis found.     Problem List There are no active problems to display for this patient.   Guss Bunde, PT, DPT 01/31/2019, 8:30 AM  [facility] [address] [place], [place], [address] Phone: [phone]   Fax:  [phone]  Name: Lucas Erickson MRN: [id number] Date of Birth: 2001/02/28

## 2019-02-03 ENCOUNTER — Ambulatory Visit: Payer: Managed Care, Other (non HMO) | Admitting: Physical Therapy

## 2019-02-03 ENCOUNTER — Other Ambulatory Visit: Payer: Self-pay

## 2019-02-03 ENCOUNTER — Encounter: Payer: Self-pay | Admitting: Physical Therapy

## 2019-02-03 DIAGNOSIS — R2689 Other abnormalities of gait and mobility: Secondary | ICD-10-CM

## 2019-02-03 DIAGNOSIS — M25551 Pain in right hip: Secondary | ICD-10-CM

## 2019-02-03 NOTE — Therapy (Signed)
[facility] [address] [place], [place], [address] Phone: [phone]   Fax:  [phone]  Physical Therapy Treatment  Patient Details  Name: Lucas Erickson MRN: [id number] Date of Birth: 2000/06/27 Referring Provider (PT): Cynthis Amaryllis Dyke MD   Encounter Date: 02/03/2019  PT End of Session - 02/03/19 1041    Visit Number  15    Number of Visits  20    Date for PT Re-Evaluation  03/17/19    Authorization Type  PROGRESS NOTE AT 10TH VISIT.    PT Start Time  0900    PT Stop Time  0948    PT Time Calculation (min)  48 min    Activity Tolerance  Patient tolerated treatment well    Behavior During Therapy  [facility] for tasks assessed/performed       Past Medical History:  Diagnosis Date  . Asthma 2011    History reviewed. No pertinent surgical history.  There were no vitals filed for this visit.  Subjective Assessment - 02/03/19 1039    Subjective  COVID19 screening performed prior to entering the building. Patient reports feeling good with minimal soreness after last session.    Patient Stated Goals  "Be able to walk like normal again with minimal pain."    Currently in Pain?  No/denies         [facility] PT Assessment - 02/03/19 0001      Assessment   Medical Diagnosis  Neoplasm of unspecified behavior of bone, soft tissue and skin.    Referring Provider (PT)  Cynthis Amaryllis Dyke MD    Onset Date/Surgical Date  09/16/18    Next MD Visit  03/01/2019                   [facility] Adult PT Treatment/Exercise - 02/03/19 0001      Knee/Hip Exercises: Aerobic   Elliptical  L5, R5 x 10 mins 5 fwd, 5 bwd      Knee/Hip Exercises: Machines for Strengthening   Cybex Knee Extension  30# 3x10; 20# 2x10 eccentric control Right    Cybex Knee Flexion  50# 3x10; 40# 3x10 eccentric control right    Cybex Leg Press  3 pl, seat 6 2x15 reps; 3 plates eccentric R control 3x10      Knee/Hip Exercises: Standing   Lunge Walking - Round  Trips  x3 with 5# carpeted hall way    Walking with Sports Cord  forward, backwards, x10 on cable column 70#    Other Standing Knee Exercises  R bulgarian split squat 5# 2x10 reps                  PT Long Term Goals - 02/03/19 1054      PT LONG TERM GOAL #1   Title  Independent with a HEP.    Time  6    Period  Weeks    Status  Achieved      PT LONG TERM GOAL #2   Title  Increase right hip strength to a solid 5/5 to increase stability for functional activties.    Time  6    Period  Weeks    Status  On-going      PT LONG TERM GOAL #3   Title  Walk a community distance without assistive device and no pain.    Time  6    Period  Weeks    Status  Achieved      PT LONG TERM  GOAL #4   Title  Perform ADL's with pain not > 2/10.    Time  6    Period  Weeks    Status  Achieved            Plan - 02/03/19 1042    Clinical Impression Statement  Patient responded well to therapy session with reports of muscle fatigue. Patient was able to demonstrate good form with all exercises including new TEs. Patient is improving with strength but goal is still ongoing.    Examination-Activity Limitations  Locomotion Level    Stability/Clinical Decision Making  Stable/Uncomplicated    Rehab Potential  Excellent    PT Frequency  2x / week    PT Duration  6 weeks    PT Treatment/Interventions  ADLs/Self Care Home Management;Cryotherapy;Electrical Stimulation;Gait training;Moist Heat;Functional mobility training;Therapeutic activities;Therapeutic exercise;Patient/family education;Passive range of motion;Manual techniques    PT Next Visit Plan  progress flexion and extension hip exercises, Nustep, gait training activities, PRE's without pain increase.  Modalites as needed.    Consulted and Agree with Plan of Care  Patient       Patient will benefit from skilled therapeutic intervention in order to improve the following deficits and impairments:  Abnormal gait, Pain, Decreased  activity tolerance, Decreased strength  Visit Diagnosis: Pain in right hip  Other abnormalities of gait and mobility     Problem List There are no active problems to display for this patient.   Gabriela Eves, PT, DPT 02/03/2019, 10:55 AM  [facility] [address] [place], [place], [address] Phone: [phone]   Fax:  [phone]  Name: GILLIS BOARDLEY MRN: [id number] Date of Birth: Sep 09, 2000

## 2019-02-07 ENCOUNTER — Encounter: Payer: Self-pay | Admitting: Physical Therapy

## 2019-02-07 ENCOUNTER — Other Ambulatory Visit: Payer: Self-pay

## 2019-02-07 ENCOUNTER — Ambulatory Visit: Payer: Managed Care, Other (non HMO) | Admitting: Physical Therapy

## 2019-02-07 DIAGNOSIS — M25551 Pain in right hip: Secondary | ICD-10-CM | POA: Diagnosis not present

## 2019-02-07 DIAGNOSIS — R2689 Other abnormalities of gait and mobility: Secondary | ICD-10-CM

## 2019-02-07 NOTE — Therapy (Signed)
[facility] [place], [place], [address] Phone: [phone]   Fax:  [phone]  Physical Therapy Treatment  Patient Details  Name: Lucas Erickson MRN: [id number] Date of Birth: 2000/11/26 Referring Provider (PT): Cynthis Beatriz Chancellor MD   Encounter Date: 02/07/2019  PT End of Session - 02/07/19 0820    Visit Number  16    Number of Visits  20    Date for PT Re-Evaluation  03/17/19    Authorization Type  PROGRESS NOTE AT 10TH VISIT.    PT Start Time  0730    PT Stop Time  0818    PT Time Calculation (min)  48 min    Activity Tolerance  Patient tolerated treatment well    Behavior During Therapy  WFL for tasks assessed/performed       Past Medical History:  Diagnosis Date  . Asthma 2011    History reviewed. No pertinent surgical history.  There were no vitals filed for this visit.  Subjective Assessment - 02/07/19 0815    Subjective  COVID-19 screening performed upon arrival. Patient arrives with reports with ongoing soreness but feeling good.    Patient Stated Goals  "Be able to walk like normal again with minimal pain."    Currently in Pain?  No/denies         [facility] PT Assessment - 02/07/19 0001      Assessment   Medical Diagnosis  Neoplasm of unspecified behavior of bone, soft tissue and skin.    Referring Provider (PT)  Cynthis Beatriz Chancellor MD    Onset Date/Surgical Date  09/16/18    Next MD Visit  03/01/2019                   [facility] Adult PT Treatment/Exercise - 02/07/19 0001      Knee/Hip Exercises: Aerobic   Elliptical  L6, R6 x 10 mins 5 fwd, 5 bwd      Knee/Hip Exercises: Machines for Strengthening   Cybex Knee Extension  30# 3x10; 20# 2x10 eccentric control Right    Cybex Knee Flexion  50# 3x10; 40# 3x10 eccentric control right    Cybex Leg Press  4 pl, seat 6 2x to fatigue; 3pl eccentric control seat 6 2x to fatigue    Hip Cybex  hip flexion 10# on cable column 3x10; hip extension,  knee bent 10# and knee straight 20# 3x10 each                   PT Long Term Goals - 02/03/19 1054      PT LONG TERM GOAL #1   Title  Independent with a HEP.    Time  6    Period  Weeks    Status  Achieved      PT LONG TERM GOAL #2   Title  Increase right hip strength to a solid 5/5 to increase stability for functional activties.    Time  6    Period  Weeks    Status  On-going      PT LONG TERM GOAL #3   Title  Walk a community distance without assistive device and no pain.    Time  6    Period  Weeks    Status  Achieved      PT LONG TERM GOAL #4   Title  Perform ADL's with pain not > 2/10.    Time  6    Period  Weeks  Status  Achieved            Plan - 02/07/19 9379    Clinical Impression Statement  Patient responded well to progression of strengthening exercises. Patient noted with a slight increase of right anterior hip pain with resisted hip flexion, pain reduced after decreasing the weight. Hip flexor in supine and standing was provided for HEP to which patient reported understanding.    Examination-Activity Limitations  Locomotion Level    Stability/Clinical Decision Making  Stable/Uncomplicated    Clinical Decision Making  Moderate    Rehab Potential  Excellent    PT Frequency  2x / week    PT Duration  6 weeks    PT Treatment/Interventions  ADLs/Self Care Home Management;Cryotherapy;Electrical Stimulation;Gait training;Moist Heat;Functional mobility training;Therapeutic activities;Therapeutic exercise;Patient/family education;Passive range of motion;Manual techniques    PT Next Visit Plan  progress flexion and extension hip exercises, Nustep, gait training activities, PRE's without pain increase.  Modalites as needed.    Consulted and Agree with Plan of Care  Patient       Patient will benefit from skilled therapeutic intervention in order to improve the following deficits and impairments:  Abnormal gait, Pain, Decreased activity tolerance,  Decreased strength  Visit Diagnosis: Pain in right hip  Other abnormalities of gait and mobility     Problem List There are no active problems to display for this patient.   Guss Bunde, PT, DPT 02/07/2019, 8:23 AM  [facility] [address] [place], [place], [address] Phone: [phone]   Fax:  [phone]  Name: Lucas Erickson MRN: [id number] Date of Birth: [birth year]

## 2019-02-10 ENCOUNTER — Ambulatory Visit: Payer: Managed Care, Other (non HMO) | Admitting: *Deleted

## 2019-02-10 ENCOUNTER — Other Ambulatory Visit: Payer: Self-pay

## 2019-02-10 DIAGNOSIS — R2689 Other abnormalities of gait and mobility: Secondary | ICD-10-CM

## 2019-02-10 DIAGNOSIS — M25551 Pain in right hip: Secondary | ICD-10-CM

## 2019-02-10 NOTE — Therapy (Signed)
[facility] [place], [place], [address] Phone: [phone]   Fax:  [phone]  Physical Therapy Treatment  Patient Details  Name: Lucas Erickson MRN: [id number] Date of Birth: Jun 12, 2000 Referring Provider (PT): Cynthis Beatriz Chancellor MD   Encounter Date: 02/10/2019  PT End of Session - 02/10/19 0909    Visit Number  17    Number of Visits  20    Date for PT Re-Evaluation  03/17/19    Authorization Type  PROGRESS NOTE AT 10TH VISIT.    PT Start Time  0900    PT Stop Time  0948    PT Time Calculation (min)  48 min       Past Medical History:  Diagnosis Date  . Asthma 2011    No past surgical history on file.  There were no vitals filed for this visit.  Subjective Assessment - 02/10/19 0907    Subjective  COVID-19 screening performed upon arrival. Patient arrives with reports with ongoing soreness in hip,  but feeling good. To MD 03-01-19    Patient Stated Goals  "Be able to walk like normal again with minimal pain."    Currently in Pain?  No/denies    Pain Score  2     Pain Location  Hip    Pain Orientation  Right    Pain Descriptors / Indicators  Sore    Pain Type  Surgical pain    Pain Onset  More than a month ago                       [facility] Adult PT Treatment/Exercise - 02/10/19 0001      Knee/Hip Exercises: Aerobic   Elliptical  L10 R6 x 10 mins 5 fwd, 5 bwd      Knee/Hip Exercises: Machines for Strengthening   Cybex Knee Extension  30# 3x10,10#  RT LE only 3x10    Cybex Knee Flexion  50# x 20, 60#  2x15    Cybex Leg Press  4pl, seat7, 3x10      Knee/Hip Exercises: Standing   Forward Lunges  Both   walking lunges in hallway 2 laps 20#s   SLS  6# ball SLS RT LE on airex pad ball/wall toss x 5 mins    Walking with Sports Cord  forward, backwards, and lateral x10 on cable column 70# using gait belt                  PT Long Term Goals - 02/03/19 1054      PT LONG TERM GOAL  #1   Title  Independent with a HEP.    Time  6    Period  Weeks    Status  Achieved      PT LONG TERM GOAL #2   Title  Increase right hip strength to a solid 5/5 to increase stability for functional activties.    Time  6    Period  Weeks    Status  On-going      PT LONG TERM GOAL #3   Title  Walk a community distance without assistive device and no pain.    Time  6    Period  Weeks    Status  Achieved      PT LONG TERM GOAL #4   Title  Perform ADL's with pain not > 2/10.    Time  6    Period  Weeks  Status  Achieved            Plan - 02/10/19 0943    Clinical Impression Statement  Pt arrived today doing fairly well and reports mainly soreness still in RT hip. He was able to complete all therex today for strengthening and balance with mainly fatigue end of session. He will f/u with MD 03-01-19 and currently has 3 visits left. He has met all LTGs except strength due to milod deficit    Stability/Clinical Decision Making  Stable/Uncomplicated    Rehab Potential  Excellent    PT Frequency  2x / week    PT Duration  6 weeks    PT Treatment/Interventions  ADLs/Self Care Home Management;Cryotherapy;Electrical Stimulation;Gait training;Moist Heat;Functional mobility training;Therapeutic activities;Therapeutic exercise;Patient/family education;Passive range of motion;Manual techniques    PT Next Visit Plan  progress flexion and extension hip exercises, Nustep, gait training activities, PRE's without pain increase.  Modalites as needed.       Patient will benefit from skilled therapeutic intervention in order to improve the following deficits and impairments:  Abnormal gait, Pain, Decreased activity tolerance, Decreased strength  Visit Diagnosis: Pain in right hip  Other abnormalities of gait and mobility     Problem List There are no active problems to display for this patient.   RAMSEUR,CHRIS, PTA 02/10/2019, 9:49 AM  [facility] [address] [place], [place], [address] Phone: [phone]   Fax:  [phone]  Name: Lucas Erickson MRN: [id number] Date of Birth: [birth year]

## 2019-02-14 ENCOUNTER — Ambulatory Visit: Payer: Managed Care, Other (non HMO) | Admitting: Physical Therapy

## 2019-02-14 ENCOUNTER — Other Ambulatory Visit: Payer: Self-pay

## 2019-02-14 ENCOUNTER — Encounter: Payer: Self-pay | Admitting: Physical Therapy

## 2019-02-14 DIAGNOSIS — M25551 Pain in right hip: Secondary | ICD-10-CM

## 2019-02-14 DIAGNOSIS — R2689 Other abnormalities of gait and mobility: Secondary | ICD-10-CM

## 2019-02-14 NOTE — Therapy (Signed)
[facility] [address] [place], [place], [address] Phone: [phone]   Fax:  [phone]  Physical Therapy Treatment  Patient Details  Name: Lucas Erickson MRN: [id number] Date of Birth: Sep 17, 2000 Referring Provider (PT): Cynthis Amaryllis Dyke MD   Encounter Date: 02/14/2019  PT End of Session - 02/14/19 0746    Visit Number  18    Number of Visits  20    Date for PT Re-Evaluation  03/17/19    Authorization Type  PROGRESS NOTE AT 10TH VISIT.    PT Start Time  [phone]    PT Stop Time  0817    PT Time Calculation (min)  46 min    Activity Tolerance  Patient tolerated treatment well    Behavior During Therapy  [facility] for tasks assessed/performed       Past Medical History:  Diagnosis Date  . Asthma 2011    History reviewed. No pertinent surgical history.  There were no vitals filed for this visit.  Subjective Assessment - 02/14/19 0738    Subjective  COVID-19 screening performed upon arrival. Patient reports feeling tired with some soreness in the right hip.    Currently in Pain?  No/denies         [facility] PT Assessment - 02/14/19 0001      Assessment   Medical Diagnosis  Neoplasm of unspecified behavior of bone, soft tissue and skin.    Referring Provider (PT)  Cynthis Amaryllis Dyke MD    Onset Date/Surgical Date  09/16/18    Next MD Visit  03/01/2019                   [facility]  Adult PT Treatment/Exercise - 02/14/19 0001      Knee/Hip Exercises: Aerobic   Elliptical  L6 R6 x 10 mins 5 fwd, 5 bwd      Knee/Hip Exercises: Machines for Strengthening   Cybex Knee Extension  40# 3x10,    Cybex Knee Flexion  60#  2x15    Cybex Leg Press  4pl, seat 6, 3x10; eccentric right 3 plates 4H70      Knee/Hip Exercises: Standing   Forward Lunges  Both;Other (comment)   3 laps down carpeted hall way x3   Walking with Sports Cord  Marching, backwards, and lateral x10 on cable column 80# and 90# using gait belt      Knee/Hip  Exercises: Seated   Sit to Sand  without UE support;3 sets;10 reps;Other (comment)   single leg 10# bilateral                 PT Long Term Goals - 02/03/19 1054      PT LONG TERM GOAL #1   Title  Independent with a HEP.    Time  6    Period  Weeks    Status  Achieved      PT LONG TERM GOAL #2   Title  Increase right hip strength to a solid 5/5 to increase stability for functional activties.    Time  6    Period  Weeks    Status  On-going      PT LONG TERM GOAL #3   Title  Walk a community distance without assistive device and no pain.    Time  6    Period  Weeks    Status  Achieved      PT LONG TERM GOAL #4   Title  Perform ADL's with pain not > 2/10.  Time  6    Period  Weeks    Status  Achieved            Plan - 02/14/19 7782    Clinical Impression Statement  Patient responded well to progression of exercises with no complaints of pain just fatigue. Patient noted with good form with all exercises without the need for cuing. Patient and PT discussed two more visits in plan of care to which he can complete one a week until 03/01/2019 follow up visit or continue 2x a week. Patient reported understanding.    Examination-Activity Limitations  Locomotion Level    Stability/Clinical Decision Making  Stable/Uncomplicated    Clinical Decision Making  Moderate    Rehab Potential  Excellent    PT Frequency  2x / week    PT Duration  6 weeks    PT Treatment/Interventions  ADLs/Self Care Home Management;Cryotherapy;Electrical Stimulation;Gait training;Moist Heat;Functional mobility training;Therapeutic activities;Therapeutic exercise;Patient/family education;Passive range of motion;Manual techniques    PT Next Visit Plan  progress flexion and extension hip exercises, Nustep, gait training activities, PRE's without pain increase.  Modalites as needed.    Consulted and Agree with Plan of Care  Patient       Patient will benefit from skilled therapeutic  intervention in order to improve the following deficits and impairments:  Abnormal gait, Pain, Decreased activity tolerance, Decreased strength  Visit Diagnosis: Pain in right hip  Other abnormalities of gait and mobility     Problem List There are no active problems to display for this patient.   Gabriela Eves, PT, DPT 02/14/2019, 9:19 AM  [facility] [address] [place], [place], [address] Phone: [phone]   Fax:  [phone]  Name: Lucas Erickson MRN: [id number] Date of Birth: September 20, 2000

## 2019-02-17 ENCOUNTER — Encounter: Payer: Managed Care, Other (non HMO) | Admitting: *Deleted

## 2019-02-21 ENCOUNTER — Encounter: Payer: Self-pay | Admitting: Physical Therapy

## 2019-02-21 ENCOUNTER — Other Ambulatory Visit: Payer: Self-pay

## 2019-02-21 ENCOUNTER — Ambulatory Visit: Payer: Managed Care, Other (non HMO) | Attending: Orthopedic Surgery | Admitting: Physical Therapy

## 2019-02-21 DIAGNOSIS — M25551 Pain in right hip: Secondary | ICD-10-CM | POA: Insufficient documentation

## 2019-02-21 DIAGNOSIS — R2689 Other abnormalities of gait and mobility: Secondary | ICD-10-CM | POA: Diagnosis present

## 2019-02-21 NOTE — Therapy (Signed)
[facility] [place], [place], [address] Phone: [phone]   Fax:  [phone]  Physical Therapy Treatment  Patient Details  Name: Lucas Erickson MRN: [id number] Date of Birth: 10/01/2000 Referring Provider (PT): Cynthis Beatriz Chancellor MD   Encounter Date: 02/21/2019  PT End of Session - 02/21/19 0736    Visit Number  19    Number of Visits  20    Date for PT Re-Evaluation  03/17/19    Authorization Type  PROGRESS NOTE AT 10TH VISIT.    PT Start Time  [phone]    PT Stop Time  0816    PT Time Calculation (min)  45 min    Activity Tolerance  Patient tolerated treatment well    Behavior During Therapy  [facility] for tasks assessed/performed       Past Medical History:  Diagnosis Date  . Asthma 2011    History reviewed. No pertinent surgical history.  There were no vitals filed for this visit.  Subjective Assessment - 02/21/19 0735    Subjective  COVID-19 screening performed upon arrival. Patient reports feeling good this morning.    Patient Stated Goals  "Be able to walk like normal again with minimal pain."    Currently in Pain?  No/denies         [facility] PT Assessment - 02/21/19 0001      Assessment   Medical Diagnosis  Neoplasm of unspecified behavior of bone, soft tissue and skin.    Referring Provider (PT)  Cynthis Beatriz Chancellor MD    Onset Date/Surgical Date  09/16/18    Next MD Visit  03/01/2019                   [facility] Adult PT Treatment/Exercise - 02/21/19 0001      Knee/Hip Exercises: Aerobic   Elliptical  L10 R7 x 10 mins 5 fwd, 5 bwd      Knee/Hip Exercises: Machines for Strengthening   Cybex Knee Extension  40# 2x15     Cybex Knee Flexion  60#  2x15     Cybex Leg Press  4pl, seat 6, 2X15; eccentric right 4 plates 2x15    Hip Cybex  hip flexion 10# on cable column x10      Knee/Hip Exercises: Standing   Other Standing Knee Exercises  R bulgarian split squat 10# 2x15 reps    Other Standing Knee  Exercises  10# kettlebell swings, 2x1 minute; single leg dead lift 2x15                  PT Long Term Goals - 02/03/19 1054      PT LONG TERM GOAL #1   Title  Independent with a HEP.    Time  6    Period  Weeks    Status  Achieved      PT LONG TERM GOAL #2   Title  Increase right hip strength to a solid 5/5 to increase stability for functional activties.    Time  6    Period  Weeks    Status  On-going      PT LONG TERM GOAL #3   Title  Walk a community distance without assistive device and no pain.    Time  6    Period  Weeks    Status  Achieved      PT LONG TERM GOAL #4   Title  Perform ADL's with pain not > 2/10.  Time  6    Period  Weeks    Status  Achieved            Plan - 02/21/19 1039    Clinical Impression Statement  Patient responded well to therapy session with reports of fatigue. Patient reported slight pain with resisted hip flexion exercise but was able to complete remaining reps. Patient provided with advanced balance and strengthening exercises to which patient reported understanding. Patient to be discharged next visit.    Examination-Activity Limitations  Locomotion Level    Stability/Clinical Decision Making  Stable/Uncomplicated    Clinical Decision Making  Moderate    Rehab Potential  Excellent    PT Frequency  2x / week    PT Duration  6 weeks    PT Treatment/Interventions  ADLs/Self Care Home Management;Cryotherapy;Electrical Stimulation;Gait training;Moist Heat;Functional mobility training;Therapeutic activities;Therapeutic exercise;Patient/family education;Passive range of motion;Manual techniques    PT Next Visit Plan  DC; assess goals.    Consulted and Agree with Plan of Care  Patient       Patient will benefit from skilled therapeutic intervention in order to improve the following deficits and impairments:  Abnormal gait, Pain, Decreased activity tolerance, Decreased strength  Visit Diagnosis: Pain in right hip  Other  abnormalities of gait and mobility     Problem List There are no active problems to display for this patient.   Guss Bunde, PT, DPT 02/21/2019, 10:47 AM  [facility] [address] [place], [place], [address] Phone: [phone]   Fax:  [phone]  Name: CLELAND SIMKINS MRN: [id number] Date of Birth: June 08, 2000

## 2019-03-03 ENCOUNTER — Other Ambulatory Visit: Payer: Self-pay

## 2019-03-03 ENCOUNTER — Ambulatory Visit: Payer: Managed Care, Other (non HMO) | Admitting: *Deleted

## 2019-03-03 DIAGNOSIS — M25551 Pain in right hip: Secondary | ICD-10-CM

## 2019-03-03 DIAGNOSIS — R2689 Other abnormalities of gait and mobility: Secondary | ICD-10-CM

## 2019-03-03 NOTE — Therapy (Signed)
[facility] [place], [place], [address] Phone: [phone]   Fax:  [phone]  Physical Therapy Treatment  Patient Details  Name: Lucas Erickson MRN: [id number] Date of Birth: 05/16/2000 Referring Provider (PT): Cynthis Beatriz Chancellor MD   Encounter Date: 03/03/2019  PT End of Session - 03/03/19 0919    Visit Number  20    Number of Visits  20    Date for PT Re-Evaluation  03/17/19    Authorization Type  PROGRESS NOTE AT 10TH VISIT.    PT Start Time  0900    PT Stop Time  0949    PT Time Calculation (min)  49 min       Past Medical History:  Diagnosis Date  . Asthma 2011    No past surgical history on file.  There were no vitals filed for this visit.  Subjective Assessment - 03/03/19 0911    Subjective  COVID-19 screening performed upon arrival. Doing good. DC today    Patient Stated Goals  "Be able to walk like normal again with minimal pain."                       [facility] Adult PT Treatment/Exercise - 03/03/19 0001      Knee/Hip Exercises: Aerobic   Elliptical  L10 R7 x 10 mins 5 fwd, 5 bwd      Knee/Hip Exercises: Machines for Strengthening   Cybex Knee Extension  40# 3x 20    Cybex Knee Flexion  60#  3 x 20    Cybex Leg Press  4pl, seat 6, 2X15; eccentric right 4 plates 2x15      Knee/Hip Exercises: Standing   Functional Squat  2 sets;10 reps    Lunge Walking - Round Trips  x3 with 2  10# DBs carpeted hall way    SLS  on airex                  PT Long Term Goals - 03/03/19 0920      PT LONG TERM GOAL #1   Title  Independent with a HEP.    Time  6    Period  Weeks    Status  Achieved      PT LONG TERM GOAL #2   Title  Increase right hip strength to a solid 5/5 to increase stability for functional activties.    Time  6    Period  Weeks    Status  Achieved      PT LONG TERM GOAL #3   Title  Walk a community distance without assistive device and no pain.    Time  6    Period  Weeks    Status  Achieved      PT LONG TERM GOAL #4   Title  Perform ADL's with pain not > 2/10.    Time  6    Period  Weeks    Status  Achieved            Plan - 03/03/19 0933    Clinical Impression Statement  Pt was able to perform all therex today with only fatigue. All LTGs were met and Pt will DC to Gym/HEP.    Examination-Activity Limitations  Locomotion Level    Stability/Clinical Decision Making  Stable/Uncomplicated    PT Frequency  2x / week    PT Duration  6 weeks    PT Treatment/Interventions  ADLs/Self Care Home Management;Cryotherapy;Dealer  Stimulation;Gait training;Moist Heat;Functional mobility training;Therapeutic activities;Therapeutic exercise;Patient/family education;Passive range of motion;Manual techniques    PT Next Visit Plan  DC to HEP/gym    Consulted and Agree with Plan of Care  Patient       Patient will benefit from skilled therapeutic intervention in order to improve the following deficits and impairments:  Abnormal gait, Pain, Decreased activity tolerance, Decreased strength  Visit Diagnosis: Pain in right hip  Other abnormalities of gait and mobility     Problem List There are no active problems to display for this patient.   Jazsmin Couse,CHRIS, PTA 03/03/2019, 10:33 AM  [facility] [place], [place], [address] Phone: [phone]   Fax:  [phone]  Name: Lucas Erickson MRN: [id number] Date of Birth: Mar 24, 2001  PHYSICAL THERAPY DISCHARGE SUMMARY  Visits from Start of Care: 20.      Current functional level related to goals / functional outcomes: See above.   Remaining deficits: All goals met.   Education / Equipment: HEP. Plan: Patient agrees to discharge.  Patient goals were met. Patient is being discharged due to meeting the stated rehab goals.  ?????         Mali Applegate MPT

## 2019-11-03 IMAGING — MR MR PELVIS WO/W CM
6 of 8 series · 30 of 48 positions shown · IV contrast (multihance)
Comparison: MRI of the right hip 06/01/2018.

CLINICAL DATA: Right hip pain for 4-5 months.  No known injury.

EXAM:
MRI PELVIS WITHOUT AND WITH CONTRAST
TECHNIQUE: Multiplanar, multisequence MR imaging of the pelvis was performed
both before and after administration of intravenous contrast.
CONTRAST:  15 mL MULTIHANCE GADOBENATE DIMEGLUMINE 529 MG/ML IV SOLN

[Series 4: T1 · coronal · 4.0mm · 1.29mm/px · 4 of 38 slices shown (1 of 2)]
[im 1/38]
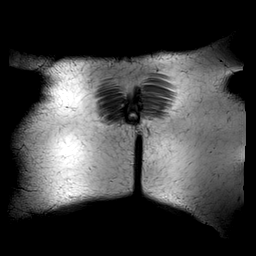
[im 13/38]
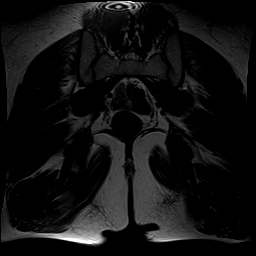
[im 25/38]
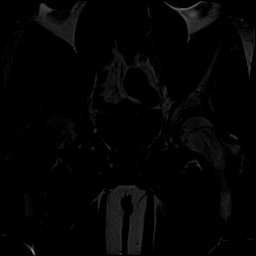
[im 38/38]
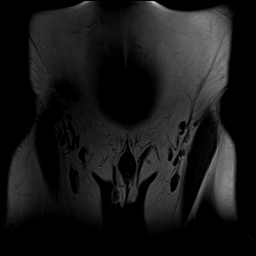

[Series 5: STIR · coronal · 4.0mm · 1.29mm/px · 4 of 38 slices shown]
[im 1/38]
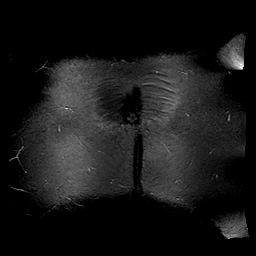
[im 13/38]
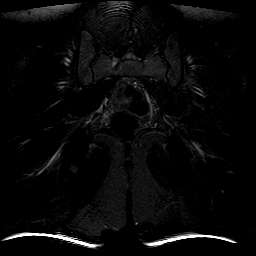
[im 25/38]
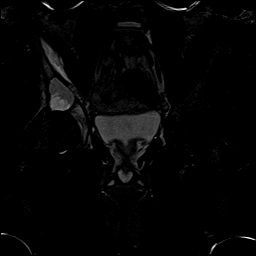
[im 38/38]
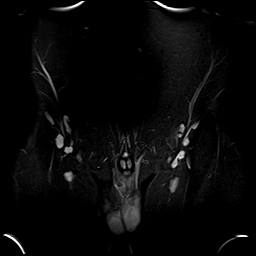

[Series 6: T1 · axial · 4.0mm · 0.64mm/px · z∈[-106,+129]mm · 7 of 48 slices shown (2 of 2)]
[im 1/48]
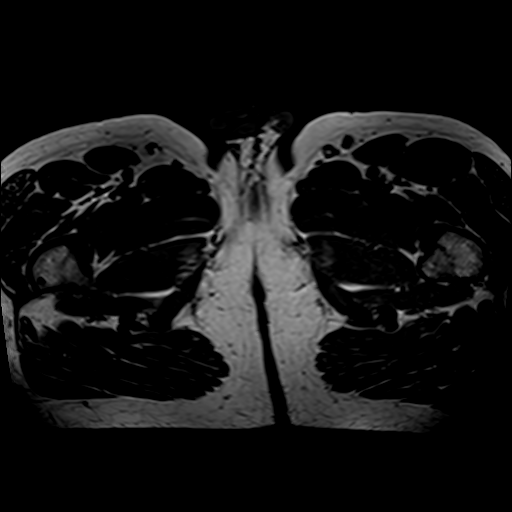
[im 8/48]
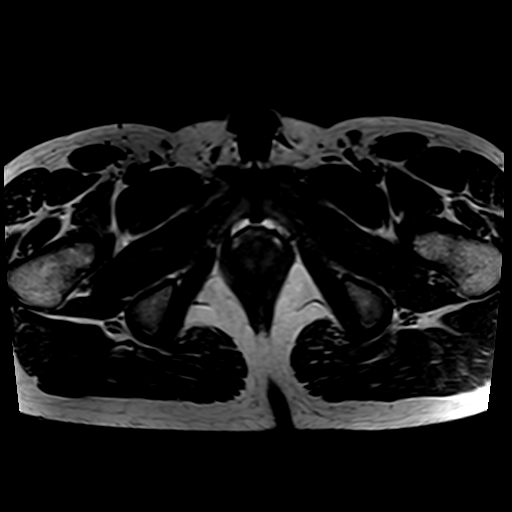
[im 16/48]
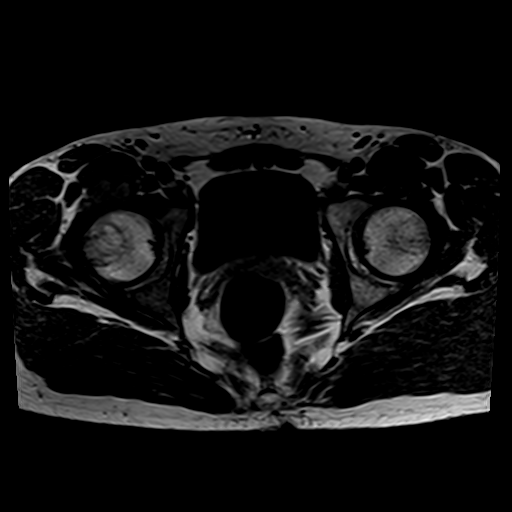
[im 24/48]
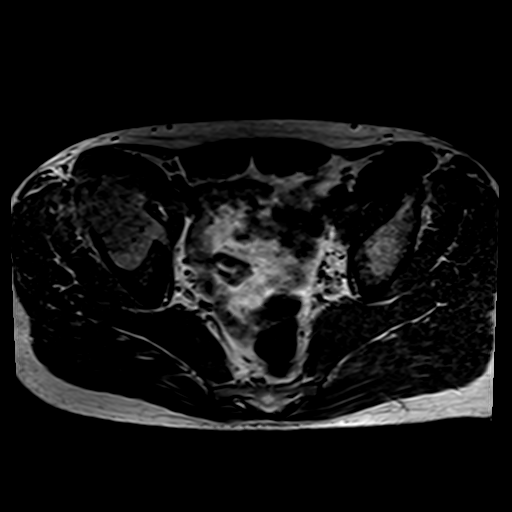
[im 32/48]
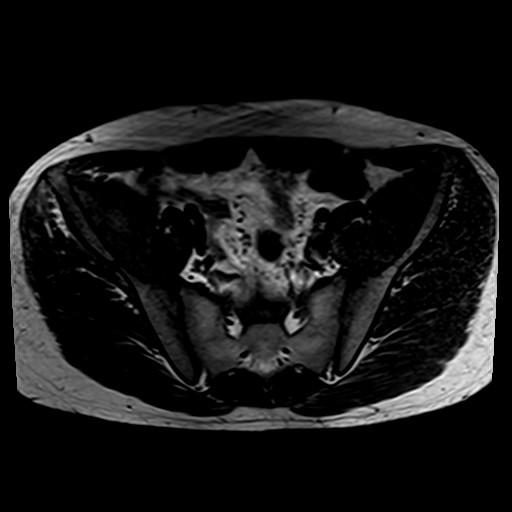
[im 40/48]
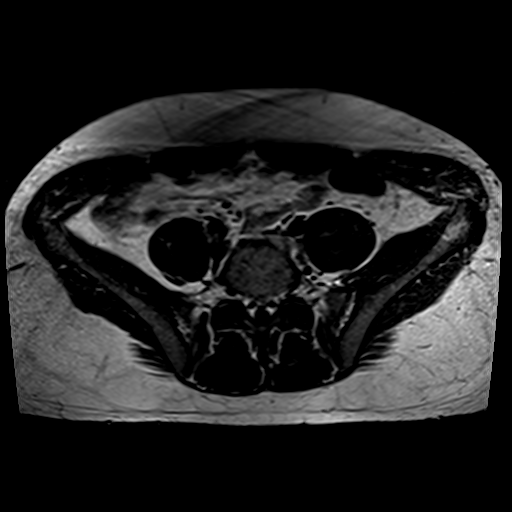
[im 48/48]
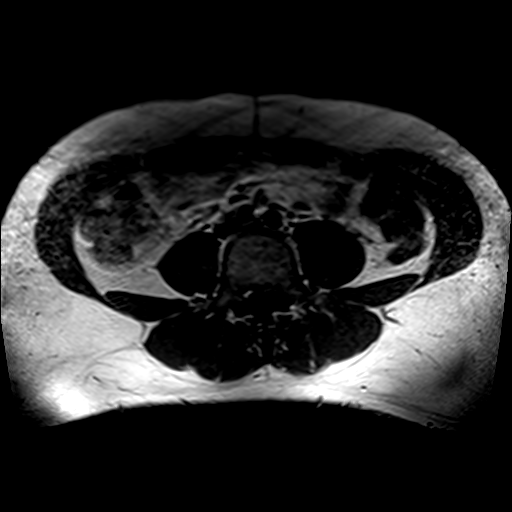

[Series 7: T2 fat-sat · axial · 4.0mm · 1.41mm/px · z∈[-106,+129]mm · 7 of 48 slices shown (1 of 2)]
[im 1/48]
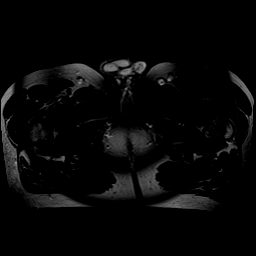
[im 8/48]
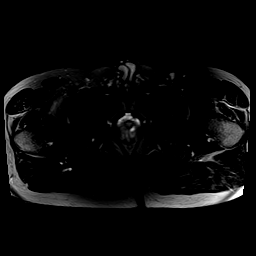
[im 16/48]
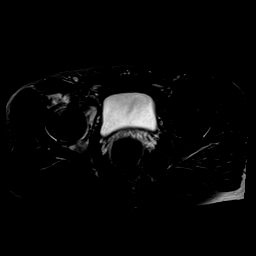
[im 24/48]
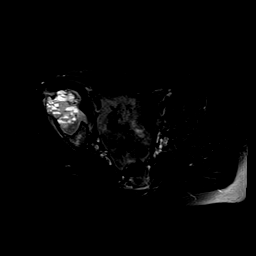
[im 32/48]
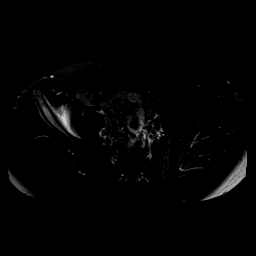
[im 40/48]
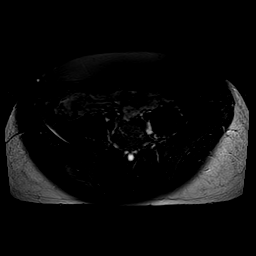
[im 48/48]
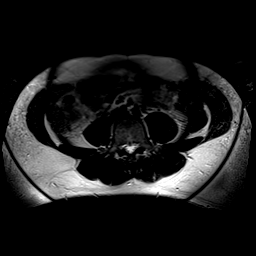

[Series 8: T2 fat-sat · sagittal · 5.0mm · 0.51mm/px · 7 of 48 slices shown (2 of 2)]
[im 1/48]
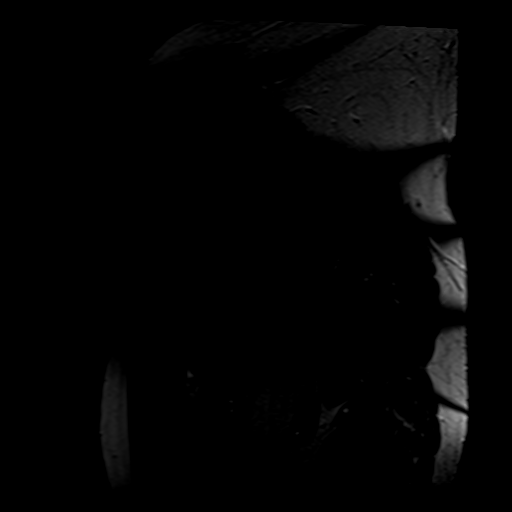
[im 8/48]
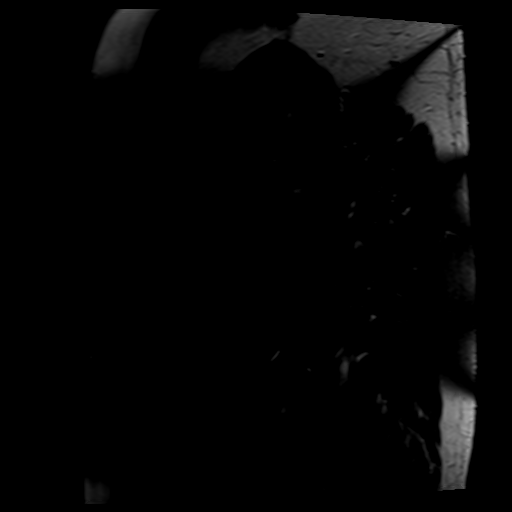
[im 16/48]
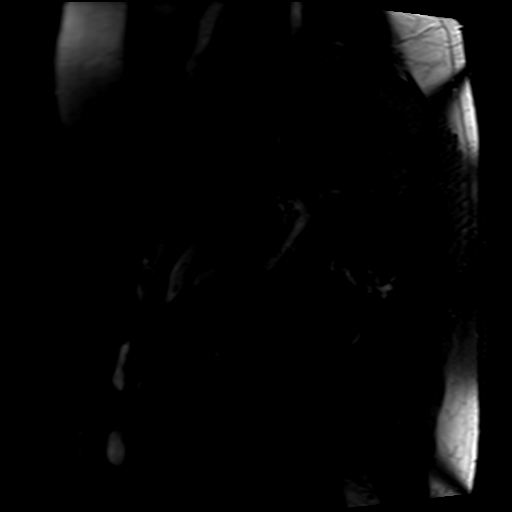
[im 24/48]
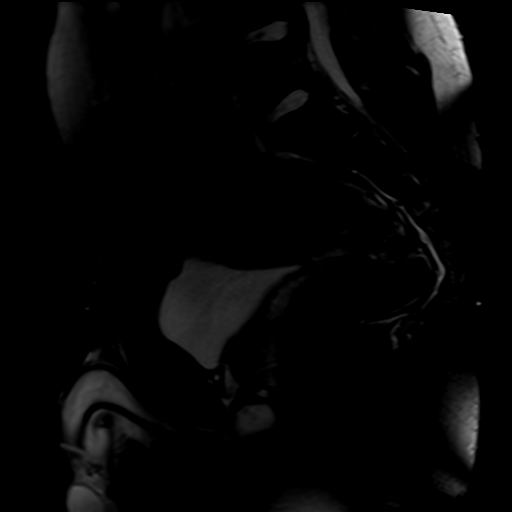
[im 32/48]
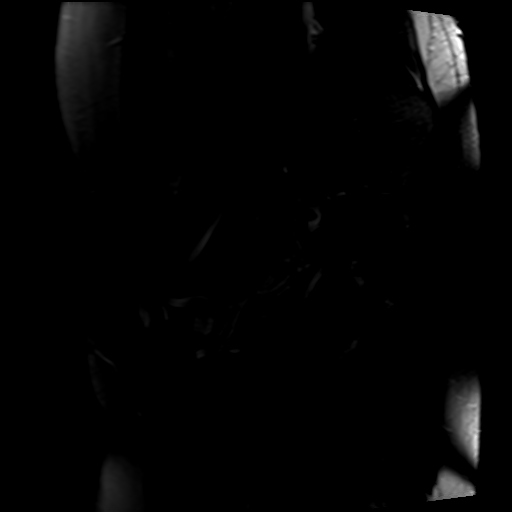
[im 40/48]
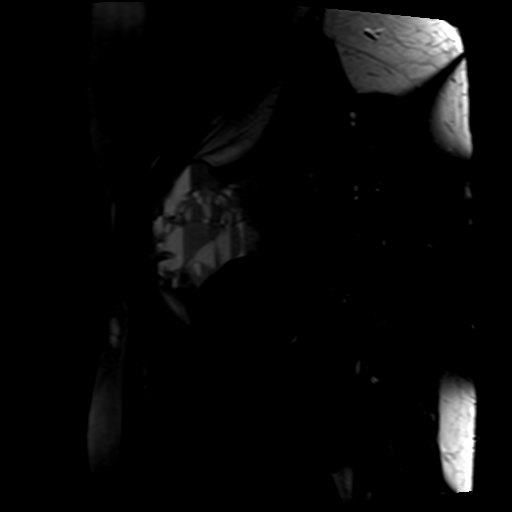
[im 48/48]
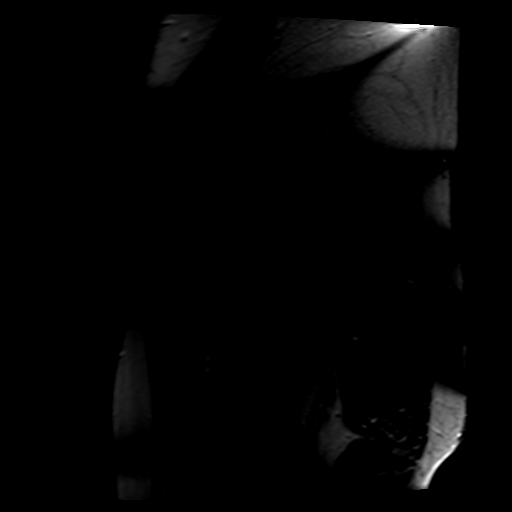

[Series 9: T1 fat-sat · axial · 4.0mm · 0.64mm/px · 1 of 48 slices shown]
[im 1/48]
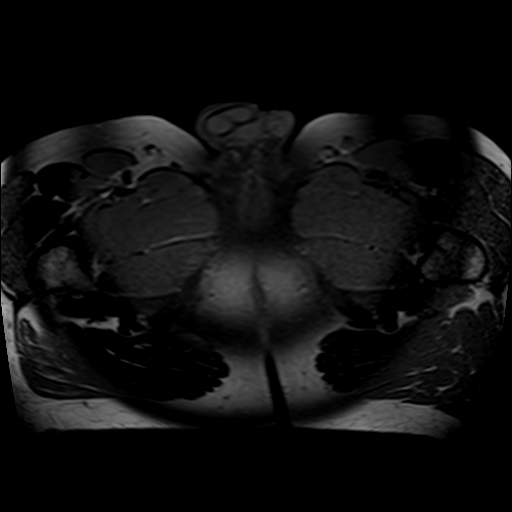

[30 of 48 positions shown; findings below may reference images not displayed]

FINDINGS: Bones/Joint/Cartilage

As seen on the prior MRI, there is an expansile lesion in the right
acetabulum measuring approximately 8.1 cm AP by 4.5 cm transverse.
The lesion extends into the medial wall of the acetabulum measuring
a total of approximately 8.5 cm craniocaudal. Multiple fluid-fluid
levels are seen within the lesion. It demonstrates minimal
enhancement. Bone marrow signal is otherwise normal.

Ligaments

Negative.

Muscles and Tendons

Negative.

Soft tissues

Negative.
IMPRESSION: Expansile lesion centered in the right acetabulum is nonspecific.
Differential considerations include aneurysmal bone cyst,
chondroblastoma or telangiectatic osteosarcoma. Recommend
consultation with Orthopedic Oncology.

## 2020-09-02 DIAGNOSIS — M8550 Aneurysmal bone cyst, unspecified site: Secondary | ICD-10-CM | POA: Diagnosis not present

## 2020-09-02 DIAGNOSIS — M85561 Aneurysmal bone cyst, right lower leg: Secondary | ICD-10-CM | POA: Diagnosis not present

## 2020-09-02 DIAGNOSIS — M85552 Aneurysmal bone cyst, left thigh: Secondary | ICD-10-CM | POA: Diagnosis not present

## 2020-09-02 DIAGNOSIS — Z4789 Encounter for other orthopedic aftercare: Secondary | ICD-10-CM | POA: Diagnosis not present

## 2020-09-02 DIAGNOSIS — R918 Other nonspecific abnormal finding of lung field: Secondary | ICD-10-CM | POA: Diagnosis not present

## 2020-09-02 DIAGNOSIS — Z9889 Other specified postprocedural states: Secondary | ICD-10-CM | POA: Diagnosis not present

## 2020-09-06 DIAGNOSIS — M9903 Segmental and somatic dysfunction of lumbar region: Secondary | ICD-10-CM | POA: Diagnosis not present

## 2020-09-06 DIAGNOSIS — M9905 Segmental and somatic dysfunction of pelvic region: Secondary | ICD-10-CM | POA: Diagnosis not present

## 2020-09-06 DIAGNOSIS — M9902 Segmental and somatic dysfunction of thoracic region: Secondary | ICD-10-CM | POA: Diagnosis not present

## 2020-09-06 DIAGNOSIS — M62830 Muscle spasm of back: Secondary | ICD-10-CM | POA: Diagnosis not present

## 2021-04-29 ENCOUNTER — Ambulatory Visit
Admission: RE | Admit: 2021-04-29 | Discharge: 2021-04-29 | Disposition: A | Discharge status: Home / Self Care | Payer: BC Managed Care – PPO | Source: Ambulatory Visit | Attending: Family Medicine | Admitting: Family Medicine

## 2021-04-29 ENCOUNTER — Other Ambulatory Visit: Payer: Self-pay

## 2021-04-29 VITALS — BP 151/91 | HR 87 | Temp 98.0°F | Resp 18

## 2021-04-29 DIAGNOSIS — R5383 Other fatigue: Secondary | ICD-10-CM

## 2021-04-29 LAB — POCT FASTING CBG KUC MANUAL ENTRY: POCT Glucose (KUC): 86 mg/dL (ref 70–99)

## 2021-04-29 NOTE — ED Provider Notes (Signed)
[facility]    CSN: [id number] Arrival date & time: 04/29/21  1543      History   Chief Complaint Chief Complaint  Patient presents with   Blood Sugar Problem    HPI Lucas Erickson is a 21 y.o. male.   Patient presenting today concerned about abnormal blood sugars as he has been feeling abnormal the past few weeks.  He states he feels very fatigued off and on, sometimes improved with eating a snack other times not.  He states he does have periods of time where he feels normal mixed in.  Denies recent illness, upper respiratory symptoms, nausea vomiting or diarrhea, urinary symptoms, fever, chills.  He states that he used to be followed by his primary care loosely for elevated blood sugar readings but was never diagnosed with anything or placed on medications.  He has been managing it with diet but has not followed up in quite some time.   Past Medical History:  Diagnosis Date   Asthma 2011    There are no problems to display for this patient.   History reviewed. No pertinent surgical history.     Home Medications    Prior to Admission medications   Not on File    Family History Family History  Problem Relation Age of Onset   Diabetes Father        pre-diabetic   Hypertension Paternal Grandmother     Social History Social History   Tobacco Use   Smoking status: Never    Passive exposure: Yes   Smokeless tobacco: Never  Vaping Use   Vaping Use: Never used  Substance Use Topics   Alcohol use: Yes    Comment: occas   Drug use: No     Allergies   Patient has no known allergies.   Review of Systems Review of Systems Per HPI  Physical Exam Triage Vital Signs ED Triage Vitals  Enc Vitals Group     BP 04/29/21 1626 (!) 151/91     Pulse Rate 04/29/21 1626 87     Resp 04/29/21 1626 18     Temp 04/29/21 1626 98 F (36.7 C)     Temp Source 04/29/21 1626 Oral     SpO2 04/29/21 1626 99 %     Weight --      Height --      Head  Circumference --      Peak Flow --      Pain Score 04/29/21 1624 0     Pain Loc --      Pain Edu? --      Excl. in GC? --    No data found.  Updated Vital Signs BP (!) 151/91 (BP Location: Right Arm)    Pulse 87    Temp 98 F (36.7 C) (Oral)    Resp 18    SpO2 99%   Visual Acuity Right Eye Distance:   Left Eye Distance:   Bilateral Distance:    Right Eye Near:   Left Eye Near:    Bilateral Near:     Physical Exam Vitals and nursing note reviewed.  Constitutional:      Appearance: Normal appearance.  HENT:     Head: Atraumatic.     Mouth/Throat:     Mouth: Mucous membranes are moist.  Eyes:     Extraocular Movements: Extraocular movements intact.     Conjunctiva/sclera: Conjunctivae normal.  Cardiovascular:     Rate and Rhythm: Normal rate and regular  rhythm.     Heart sounds: Normal heart sounds.  Pulmonary:     Effort: Pulmonary effort is normal.     Breath sounds: Normal breath sounds. No wheezing or rales.  Abdominal:     General: Bowel sounds are normal. There is no distension.     Palpations: Abdomen is soft.     Tenderness: There is no abdominal tenderness. There is no guarding.  Musculoskeletal:        General: Normal range of motion.     Cervical back: Normal range of motion and neck supple.  Skin:    General: Skin is warm and dry.  Neurological:     General: No focal deficit present.     Mental Status: He is oriented to person, place, and time.  Psychiatric:        Mood and Affect: Mood normal.        Thought Content: Thought content normal.        Judgment: Judgment normal.     UC Treatments / Results  Labs (all labs ordered are listed, but only abnormal results are displayed) Labs Reviewed  CBC WITH DIFFERENTIAL/PLATELET  COMPREHENSIVE METABOLIC PANEL  POCT FASTING CBG [facility] MANUAL ENTRY    EKG   Radiology No results found.  Procedures Procedures (including critical care time)  Medications Ordered in UC Medications - No data to  display  Initial Impression / Assessment and Plan / UC Course  I have reviewed the triage vital signs and the nursing notes.  Pertinent labs & imaging results that were available during my care of the patient were reviewed by me and considered in my medical decision making (see chart for details).     Vital signs and exam benign and reassuring, no obvious cause of his symptoms over the past few weeks.  Fingerstick glucose 86 today.  We will obtain some basic labs for safety check and have him follow-up with primary care as soon as possible.  Discussed high-fiber and high-protein diet, reducing simple carbohydrates in case blood sugars are fluctuating.  Return for any acutely worsening symptoms at any time.  Final Clinical Impressions(s) / UC Diagnoses   Final diagnoses:  Other fatigue   Discharge Instructions   None    ED Prescriptions   None    PDMP not reviewed this encounter.   Particia Nearing, [place] 04/29/21 [phone]

## 2021-04-29 NOTE — ED Triage Notes (Signed)
Patient states he has had low blood sugars fr a while.   Patient states usually if he is feeling bad he just gets something to eat and feels better but that is not working now.   Patient has not checked his Blood sugars because he has not been diagnosed with Diabetes  He states he is feeling really fatigue everyday

## 2021-04-30 LAB — CBC WITH DIFFERENTIAL/PLATELET
Basophils Absolute: 0 10*3/uL (ref 0.0–0.2)
Basos: 0 %
EOS (ABSOLUTE): 0.1 10*3/uL (ref 0.0–0.4)
Eos: 2 %
Hematocrit: 46.2 % (ref 37.5–51.0)
Hemoglobin: 16 g/dL (ref 13.0–17.7)
Immature Grans (Abs): 0 10*3/uL (ref 0.0–0.1)
Immature Granulocytes: 0 %
Lymphocytes Absolute: 1.9 10*3/uL (ref 0.7–3.1)
Lymphs: 28 %
MCH: 29.9 pg (ref 26.6–33.0)
MCHC: 34.6 g/dL (ref 31.5–35.7)
MCV: 86 fL (ref 79–97)
Monocytes Absolute: 0.8 10*3/uL (ref 0.1–0.9)
Monocytes: 12 %
Neutrophils Absolute: 4 10*3/uL (ref 1.4–7.0)
Neutrophils: 58 %
Platelets: 202 10*3/uL (ref 150–450)
RBC: 5.36 x10E6/uL (ref 4.14–5.80)
RDW: 13.6 % (ref 11.6–15.4)
WBC: 6.8 10*3/uL (ref 3.4–10.8)

## 2021-04-30 LAB — COMPREHENSIVE METABOLIC PANEL
ALT: 44 IU/L (ref 0–44)
AST: 27 IU/L (ref 0–40)
Albumin/Globulin Ratio: 2 (ref 1.2–2.2)
Albumin: 4.9 g/dL (ref 4.1–5.2)
Alkaline Phosphatase: 97 IU/L (ref 44–121)
BUN/Creatinine Ratio: 15 (ref 9–20)
BUN: 14 mg/dL (ref 6–20)
Bilirubin Total: 0.4 mg/dL (ref 0.0–1.2)
CO2: 19 mmol/L — ABNORMAL LOW (ref 20–29)
Calcium: 9.4 mg/dL (ref 8.7–10.2)
Chloride: 102 mmol/L (ref 96–106)
Creatinine, Ser: 0.94 mg/dL (ref 0.76–1.27)
Globulin, Total: 2.5 g/dL (ref 1.5–4.5)
Glucose: 89 mg/dL (ref 70–99)
Potassium: 4 mmol/L (ref 3.5–5.2)
Sodium: 137 mmol/L (ref 134–144)
Total Protein: 7.4 g/dL (ref 6.0–8.5)
eGFR: 118 mL/min/{1.73_m2} (ref >59)

## 2021-05-07 ENCOUNTER — Ambulatory Visit: Payer: Managed Care, Other (non HMO) | Admitting: Nurse Practitioner

## 2021-05-22 ENCOUNTER — Ambulatory Visit: Payer: BC Managed Care – PPO | Admitting: Nurse Practitioner

## 2021-05-26 ENCOUNTER — Encounter: Payer: Self-pay | Admitting: Nurse Practitioner

## 2021-05-26 ENCOUNTER — Other Ambulatory Visit: Payer: Self-pay

## 2021-05-26 ENCOUNTER — Ambulatory Visit (INDEPENDENT_AMBULATORY_CARE_PROVIDER_SITE_OTHER): Payer: BC Managed Care – PPO | Admitting: Nurse Practitioner

## 2021-05-26 VITALS — BP 104/67 | HR 91 | Temp 97.8°F | Ht 73.0 in | Wt 215.8 lb

## 2021-05-26 DIAGNOSIS — R079 Chest pain, unspecified: Secondary | ICD-10-CM

## 2021-05-26 DIAGNOSIS — G4719 Other hypersomnia: Secondary | ICD-10-CM

## 2021-05-26 DIAGNOSIS — Z7689 Persons encountering health services in other specified circumstances: Secondary | ICD-10-CM

## 2021-05-26 DIAGNOSIS — R519 Headache, unspecified: Secondary | ICD-10-CM | POA: Diagnosis not present

## 2021-05-26 DIAGNOSIS — K219 Gastro-esophageal reflux disease without esophagitis: Secondary | ICD-10-CM

## 2021-05-26 DIAGNOSIS — Z6828 Body mass index (BMI) 28.0-28.9, adult: Secondary | ICD-10-CM

## 2021-05-26 MED ORDER — OMEPRAZOLE 40 MG PO CPDR: 40.0000 mg | DELAYED_RELEASE_CAPSULE | Freq: Every day | ORAL | 1 refills | Status: DC

## 2021-05-26 NOTE — Progress Notes (Signed)
New Patient Office Visit  Subjective:  Patient ID: Lucas Erickson, male    DOB: [birth year]  Age: 21 y.o. MRN: [id number]  CC:  Chief Complaint  Patient presents with   New Patient (Initial Visit)    HPI Lucas Erickson presents to establish new primary care provider. States that he has always had trouble with getting out of bed after sleeping. 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 Falls asleep ok. But wakes up frequently.  Her mouth breathes during the night. Wakes up with headaches. States that he often oversleeps. Feels lack of concentration, and "brain fog" since going back to day shift. He denies falling asleep while at work. He does get sleepy while driving, but he has not fallen asleep while driving. He does fall asleep very easily. Can sit still for just a few minutes and will fall asleep. He states that partner used to tell him that he snored pretty loudly, but she states he doesn't snore as bad as he used to.  Patient feels like he has been developing acid reflux. He states that he has some burning in the center of his chest and burning in the esophagus. He has tried tums and zantac. These have helped. States that sometimes, it is hard to swallow if he does not have a lot to drink.  He states that he feels like his throat is a big reason for his difficulty sleeping and for the acid reflux.   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    Family History  Problem Relation Age of Onset   Diabetes Father        pre-diabetic   Hypertension Paternal Grandmother     Social History   Socioeconomic History   Marital status: Single    Spouse name: Not on file   Number of children: Not on file   Years of education: Not on file   Highest education level: Not on file  Occupational History   Not on  file  Tobacco Use   Smoking status: Never    Passive exposure: Yes   Smokeless tobacco: Never  Vaping Use   Vaping Use: Never used  Substance and Sexual Activity   Alcohol use: Yes    Comment: occas   Drug use: No   Sexual activity: Yes    Birth control/protection: Condom  Other Topics Concern   Not on file  Social History Narrative   Not on file   Social Determinants of Health   Financial Resource Strain: Not on file  Food Insecurity: Not on file  Transportation Needs: Not on file  Physical Activity: Not on file  Stress: Not on file  Social Connections: Not on file  Intimate Partner Violence: Not on file    ROS Review of Systems  Constitutional:  Positive for fatigue. Negative for activity change, chills and fever.       Frequent dry throat   HENT:  Positive for trouble swallowing. Negative for congestion, postnasal drip, rhinorrhea, sinus pressure, sinus pain and sneezing.        Frequent throat clearing. Gets worse after eating.   Eyes: Negative.   Respiratory:  Negative for cough, shortness of breath and wheezing.   Cardiovascular:  Positive for chest pain. Negative for palpitations.  Likely related to heartburn, but unsure of etiology.   Gastrointestinal:  Negative for constipation, diarrhea, nausea and vomiting.       Increased acid reflux. Possible heartburn.   Endocrine: Negative for cold intolerance, heat intolerance, polydipsia and polyuria.  Genitourinary:  Negative for dysuria, frequency and urgency.  Musculoskeletal:  Negative for back pain and myalgias.  Skin:  Negative for rash.  Allergic/Immunologic: Negative for environmental allergies.  Neurological:  Negative for dizziness, weakness and headaches.  Psychiatric/Behavioral:  The patient is not nervous/anxious.    Objective:   Today's Vitals   05/26/21 1611  BP: 104/67  Pulse: 91  Temp: 97.8 F (36.6 C)  SpO2: 100%  Weight: 215 lb 12.8 oz (97.9 kg)  Height: 6\' 1"  (1.854 m)   Body mass  index is 28.47 kg/m.   Physical Exam Vitals and nursing note reviewed.  Constitutional:      Appearance: Normal appearance. He is well-developed.  HENT:     Head: Normocephalic and atraumatic.     Nose: Nose normal.     Mouth/Throat:     Mouth: Mucous membranes are moist.     Pharynx: Oropharynx is clear.  Eyes:     Extraocular Movements: Extraocular movements intact.     Conjunctiva/sclera: Conjunctivae normal.     Pupils: Pupils are equal, round, and reactive to light.  Cardiovascular:     Rate and Rhythm: Normal rate and regular rhythm.     Pulses: Normal pulses.     Heart sounds: Normal heart sounds.     Comments: ECG is within normal limits during today's visit  Pulmonary:     Effort: Pulmonary effort is normal.     Breath sounds: Normal breath sounds.  Abdominal:     Palpations: Abdomen is soft.  Musculoskeletal:        General: Normal range of motion.     Cervical back: Normal range of motion and neck supple.  Lymphadenopathy:     Cervical: No cervical adenopathy.  Skin:    General: Skin is warm and dry.     Capillary Refill: Capillary refill takes less than 2 seconds.  Neurological:     General: No focal deficit present.     Mental Status: He is alert and oriented to person, place, and time.  Psychiatric:        Mood and Affect: Mood normal.        Behavior: Behavior normal.        Thought Content: Thought content normal.        Judgment: Judgment normal.    Assessment & Plan:  1. Encounter to establish care Appointment today to establish new primary care provider    2. Chest pain, unspecified type Believed to be non cardiac in nature. ECG was performed during today's visit and was within normal limits.  - EKG 12-Lead  3. Excessive daytime sleepiness Refer patient for baseline sleep study to evaluate for OSA  - Ambulatory referral to Sleep Studies  4. Chronic daily headache This may be related to poor sleep quality and OSA. Refer for sleep study for  further evaluation.  - Ambulatory referral to Sleep Studies  5. Gastroesophageal reflux disease without esophagitis Add omeprazole 40mg  daily until his next visit. Advised he limit intake of trigger foods and eating near bedtime. Encouraged him to sleel with the head of his bed raised to 30 degrees to help aide digestion.  - omeprazole (PRILOSEC) 40 MG capsule; Take 1 capsule (40 mg total) by mouth daily.  Dispense: 30 capsule; Refill: 1  6. Body mass index 28.0-28.9, adult Encourage patient to limit calorie intake to 2000 cal/day or less.  He should consume a low cholesterol, low-fat diet.  Recommended he incorporate exercise into his daily routine.    Problem List Items Addressed This Visit       Digestive   Gastroesophageal reflux disease without esophagitis   Relevant Medications   omeprazole (PRILOSEC) 40 MG capsule     Other   Chest pain   Relevant Orders   EKG 12-Lead (Completed)   Excessive daytime sleepiness   Relevant Orders   Ambulatory referral to Sleep Studies   Chronic daily headache   Relevant Orders   Ambulatory referral to Sleep Studies   Body mass index 28.0-28.9, adult   Other Visit Diagnoses     Encounter to establish care    -  Primary       Outpatient Encounter Medications as of 05/26/2021  Medication Sig   omeprazole (PRILOSEC) 40 MG capsule Take 1 capsule (40 mg total) by mouth daily.   No facility-administered encounter medications on file as of 05/26/2021.    Follow-up: Return in about 6 weeks (around 07/07/2021) for health maintenance exam, FBW a week prior to visit and add Free T4. Marland Kitchen   Ronnell Freshwater, NP

## 2021-05-26 NOTE — Patient Instructions (Signed)
Fat and Cholesterol Restricted Eating Plan Getting too much fat and cholesterol in your diet may cause health problems. Choosing the right foods helps keep your fat and cholesterol at normal levels. This can keep you from getting certain diseases. Your doctor may recommend an eating plan that includes: Total fat: ______% or less of total calories a day. This is ______g of fat a day. Saturated fat: ______% or less of total calories a day. This is ______g of saturated fat a day. Cholesterol: less than _________mg a day. Fiber: ______g a day. What are tips for following this plan? General tips Work with your doctor to lose weight if you need to. Avoid: Foods with added sugar. Fried foods. Foods with trans fat or partially hydrogenated oils. This includes some margarines and baked goods. If you drink alcohol: Limit how much you have to: 0-1 drink a day for women who are not pregnant. 0-2 drinks a day for men. Know how much alcohol is in a drink. In the [place] one drink equals one 12 oz bottle of beer (355 mL), one 5 oz glass of wine (148 mL), or one 1 oz glass of hard liquor (44 mL). Reading food labels Check food labels for: Trans fats. Partially hydrogenated oils. Saturated fat (g) in each serving. Cholesterol (mg) in each serving. Fiber (g) in each serving. Choose foods with healthy fats, such as: Monounsaturated fats and polyunsaturated fats. These include olive and canola oil, flaxseeds, walnuts, almonds, and seeds. Omega-3 fats. These are found in certain fish, flaxseed oil, and ground flaxseeds. Choose grain products that have whole grains. Look for the word "whole" as the first word in the ingredient list. Cooking Cook foods using low-fat methods. These include baking, boiling, grilling, and broiling. Eat more home-cooked foods. Eat at restaurants and buffets less often. Eat less fast food. Avoid cooking using saturated fats, such as butter, cream, palm oil, palm kernel oil, and  coconut oil. Meal planning  At meals, divide your plate into four equal parts: Fill one-half of your plate with vegetables, green salads, and fruit. Fill one-fourth of your plate with whole grains. Fill one-fourth of your plate with low-fat (lean) protein foods. Eat fish that is high in omega-3 fats at least two times a week. This includes mackerel, tuna, sardines, and salmon. Eat foods that are high in fiber, such as whole grains, beans, apples, pears, berries, broccoli, carrots, peas, and barley. What foods should I eat? Fruits All fresh, canned (in natural juice), or frozen fruits. Vegetables Fresh or frozen vegetables (raw, steamed, roasted, or grilled). Green salads. Grains Whole grains, such as whole wheat or whole grain breads, crackers, cereals, and pasta. Unsweetened oatmeal, bulgur, barley, quinoa, or brown rice. Corn or whole wheat flour tortillas. Meats and other protein foods Ground beef (85% or leaner), grass-fed beef, or beef trimmed of fat. Skinless chicken or turkey. Ground chicken or turkey. Pork trimmed of fat. All fish and seafood. Egg whites. Dried beans, peas, or lentils. Unsalted nuts or seeds. Unsalted canned beans. Nut butters without added sugar or oil. Dairy Low-fat or nonfat dairy products, such as skim or 1% milk, 2% or reduced-fat cheeses, low-fat and fat-free ricotta or cottage cheese, or plain low-fat and nonfat yogurt. Fats and oils Tub margarine without trans fats. Light or reduced-fat mayonnaise and salad dressings. Avocado. Olive, canola, sesame, or safflower oils. The items listed above may not be a complete list of foods and beverages you can eat. Contact a dietitian for more information. What foods   should I avoid? Fruits Canned fruit in heavy syrup. Fruit in cream or butter sauce. Fried fruit. Vegetables Vegetables cooked in cheese, cream, or butter sauce. Fried vegetables. Grains White bread. White pasta. White rice. Cornbread. Bagels, pastries,  and croissants. Crackers and snack foods that contain trans fat and hydrogenated oils. Meats and other protein foods Fatty cuts of meat. Ribs, chicken wings, bacon, sausage, bologna, salami, chitterlings, fatback, hot dogs, bratwurst, and packaged lunch meats. Liver and organ meats. Whole eggs and egg yolks. Chicken and turkey with skin. Fried meat. Dairy Whole or 2% milk, cream, half-and-half, and cream cheese. Whole milk cheeses. Whole-fat or sweetened yogurt. Full-fat cheeses. Nondairy creamers and whipped toppings. Processed cheese, cheese spreads, and cheese curds. Fats and oils Butter, stick margarine, lard, shortening, ghee, or bacon fat. Coconut, palm kernel, and palm oils. Beverages Alcohol. Sugar-sweetened drinks such as sodas, lemonade, and fruit drinks. Sweets and desserts Corn syrup, sugars, honey, and molasses. Candy. Jam and jelly. Syrup. Sweetened cereals. Cookies, pies, cakes, donuts, muffins, and ice cream. The items listed above may not be a complete list of foods and beverages you should avoid. Contact a dietitian for more information. Summary Choosing the right foods helps keep your fat and cholesterol at normal levels. This can keep you from getting certain diseases. At meals, fill one-half of your plate with vegetables, green salads, and fruits. Eat high fiber foods, like whole grains, beans, apples, pears, berries, carrots, peas, and barley. Limit added sugar, saturated fats, alcohol, and fried foods. This information is not intended to replace advice given to you by your health care provider. Make sure you discuss any questions you have with your health care provider. Document Revised: 08/16/2020 Document Reviewed: 08/16/2020 Elsevier Patient Education  2022 [employer]  

## 2021-05-27 DIAGNOSIS — R079 Chest pain, unspecified: Secondary | ICD-10-CM | POA: Insufficient documentation

## 2021-05-27 DIAGNOSIS — G4719 Other hypersomnia: Secondary | ICD-10-CM | POA: Insufficient documentation

## 2021-05-27 DIAGNOSIS — R519 Headache, unspecified: Secondary | ICD-10-CM | POA: Insufficient documentation

## 2021-05-27 DIAGNOSIS — K219 Gastro-esophageal reflux disease without esophagitis: Secondary | ICD-10-CM | POA: Insufficient documentation

## 2021-05-27 DIAGNOSIS — Z6828 Body mass index (BMI) 28.0-28.9, adult: Secondary | ICD-10-CM | POA: Insufficient documentation

## 2021-06-02 ENCOUNTER — Encounter: Payer: Self-pay | Admitting: Nurse Practitioner

## 2021-06-02 DIAGNOSIS — G4719 Other hypersomnia: Secondary | ICD-10-CM

## 2021-06-10 ENCOUNTER — Ambulatory Visit: Payer: BC Managed Care – PPO | Admitting: Nurse Practitioner

## 2021-06-11 NOTE — Telephone Encounter (Signed)
Please and thank you

## 2021-06-30 ENCOUNTER — Other Ambulatory Visit: Payer: Self-pay

## 2021-06-30 DIAGNOSIS — R5383 Other fatigue: Secondary | ICD-10-CM

## 2021-06-30 DIAGNOSIS — Z130 Encounter for screening for diseases of the blood and blood-forming organs and certain disorders involving the immune mechanism: Secondary | ICD-10-CM

## 2021-06-30 DIAGNOSIS — Z0000 Encounter for general adult medical examination without abnormal findings: Secondary | ICD-10-CM

## 2021-06-30 DIAGNOSIS — Z1321 Encounter for screening for nutritional disorder: Secondary | ICD-10-CM

## 2021-07-01 ENCOUNTER — Other Ambulatory Visit: Payer: Self-pay

## 2021-07-01 ENCOUNTER — Other Ambulatory Visit: Payer: BC Managed Care – PPO

## 2021-07-01 DIAGNOSIS — R5383 Other fatigue: Secondary | ICD-10-CM

## 2021-07-01 DIAGNOSIS — Z0000 Encounter for general adult medical examination without abnormal findings: Secondary | ICD-10-CM | POA: Diagnosis not present

## 2021-07-01 DIAGNOSIS — Z1321 Encounter for screening for nutritional disorder: Secondary | ICD-10-CM | POA: Diagnosis not present

## 2021-07-01 DIAGNOSIS — Z130 Encounter for screening for diseases of the blood and blood-forming organs and certain disorders involving the immune mechanism: Secondary | ICD-10-CM

## 2021-07-01 DIAGNOSIS — Z1329 Encounter for screening for other suspected endocrine disorder: Secondary | ICD-10-CM | POA: Diagnosis not present

## 2021-07-02 LAB — COMPREHENSIVE METABOLIC PANEL
ALT: 38 IU/L (ref 0–44)
AST: 24 IU/L (ref 0–40)
Albumin/Globulin Ratio: 2.2 (ref 1.2–2.2)
Albumin: 4.9 g/dL (ref 4.1–5.2)
Alkaline Phosphatase: 103 IU/L (ref 44–121)
BUN/Creatinine Ratio: 11 (ref 9–20)
BUN: 10 mg/dL (ref 6–20)
Bilirubin Total: 0.5 mg/dL (ref 0.0–1.2)
CO2: 24 mmol/L (ref 20–29)
Calcium: 9.8 mg/dL (ref 8.7–10.2)
Chloride: 101 mmol/L (ref 96–106)
Creatinine, Ser: 0.93 mg/dL (ref 0.76–1.27)
Globulin, Total: 2.2 g/dL (ref 1.5–4.5)
Glucose: 92 mg/dL (ref 70–99)
Potassium: 4.4 mmol/L (ref 3.5–5.2)
Sodium: 139 mmol/L (ref 134–144)
Total Protein: 7.1 g/dL (ref 6.0–8.5)
eGFR: 119 mL/min/{1.73_m2} (ref >59)

## 2021-07-02 LAB — CBC
Hematocrit: 47.9 % (ref 37.5–51.0)
Hemoglobin: 16.4 g/dL (ref 13.0–17.7)
MCH: 29.5 pg (ref 26.6–33.0)
MCHC: 34.2 g/dL (ref 31.5–35.7)
MCV: 86 fL (ref 79–97)
Platelets: 200 10*3/uL (ref 150–450)
RBC: 5.56 x10E6/uL (ref 4.14–5.80)
RDW: 13.1 % (ref 11.6–15.4)
WBC: 4.9 10*3/uL (ref 3.4–10.8)

## 2021-07-02 LAB — LIPID PANEL
Chol/HDL Ratio: 4.3 ratio (ref 0.0–5.0)
Cholesterol, Total: 150 mg/dL (ref 100–199)
HDL: 35 mg/dL — ABNORMAL LOW (ref >39)
LDL Chol Calc (NIH): 89 mg/dL (ref 0–99)
Triglycerides: 145 mg/dL (ref 0–149)
VLDL Cholesterol Cal: 26 mg/dL (ref 5–40)

## 2021-07-02 LAB — HEMOGLOBIN A1C
Est. average glucose Bld gHb Est-mCnc: 103 mg/dL
Hgb A1c MFr Bld: 5.2 % (ref 4.8–5.6)

## 2021-07-02 LAB — T4, FREE: Free T4: 1.24 ng/dL (ref 0.82–1.77)

## 2021-07-02 LAB — TSH: TSH: 1.96 u[IU]/mL (ref 0.450–4.500)

## 2021-07-02 NOTE — Progress Notes (Signed)
Labs good. Discuss during CPE 07/07/2021

## 2021-07-07 ENCOUNTER — Ambulatory Visit (INDEPENDENT_AMBULATORY_CARE_PROVIDER_SITE_OTHER): Payer: BC Managed Care – PPO | Admitting: Nurse Practitioner

## 2021-07-07 ENCOUNTER — Other Ambulatory Visit: Payer: Self-pay

## 2021-07-07 ENCOUNTER — Encounter: Payer: Self-pay | Admitting: Nurse Practitioner

## 2021-07-07 VITALS — BP 121/69 | HR 82 | Temp 98.1°F | Ht 73.0 in | Wt 245.0 lb

## 2021-07-07 DIAGNOSIS — K219 Gastro-esophageal reflux disease without esophagitis: Secondary | ICD-10-CM

## 2021-07-07 DIAGNOSIS — Z0001 Encounter for general adult medical examination with abnormal findings: Secondary | ICD-10-CM

## 2021-07-07 DIAGNOSIS — Z6832 Body mass index (BMI) 32.0-32.9, adult: Secondary | ICD-10-CM | POA: Diagnosis not present

## 2021-07-07 DIAGNOSIS — E785 Hyperlipidemia, unspecified: Secondary | ICD-10-CM | POA: Diagnosis not present

## 2021-07-07 DIAGNOSIS — Z23 Encounter for immunization: Secondary | ICD-10-CM

## 2021-07-07 MED ORDER — OMEPRAZOLE 40 MG PO CPDR: 40.0000 mg | DELAYED_RELEASE_CAPSULE | Freq: Every day | ORAL | 3 refills | Status: DC

## 2021-07-07 NOTE — Progress Notes (Signed)
Established patient visit ? ? ?Patient: Lucas Erickson   DOB: 06/14/2000   21 y.o. Male  MRN: [id number] ?Visit Date: 07/07/2021 ? ? ?Chief Complaint  ?Patient presents with  ? Annual Exam  ? ?Subjective  ?  ?HPI  ?The patient presents for annual wellness visit.  ?-doing better on omeprazole  ?-sleep study consultation is scheduled for next month.  ?-routine, fasting labs done prior to this visit. Slightly low HDL with other labs normal ?-due to have 3rd HPV and Tdap vaccines today.  ? ? ?Medications: ?Outpatient Medications Prior to Visit  ?Medication Sig  ? [DISCONTINUED] omeprazole (PRILOSEC) 40 MG capsule Take 1 capsule (40 mg total) by mouth daily.  ? ?No facility-administered medications prior to visit.  ? ? ?Review of Systems  ?Constitutional:  Negative for activity change, chills, fatigue and fever.  ?HENT:  Negative for congestion, postnasal drip, rhinorrhea, sinus pressure, sinus pain, sneezing and sore throat.   ?     Frequent throat clearing   ?Eyes: Negative.   ?Respiratory:  Positive for apnea. Negative for cough, shortness of breath and wheezing.   ?Cardiovascular:  Negative for chest pain and palpitations.  ?Gastrointestinal:  Negative for constipation, diarrhea, nausea and vomiting.  ?     Improved GERD   ?Endocrine: Negative for cold intolerance, heat intolerance, polydipsia and polyuria.  ?Genitourinary:  Negative for dysuria, frequency and urgency.  ?Musculoskeletal:  Negative for back pain and myalgias.  ?Skin:  Negative for rash.  ?Allergic/Immunologic: Negative for environmental allergies.  ?Neurological:  Negative for dizziness, weakness and headaches.  ?Psychiatric/Behavioral:  The patient is not nervous/anxious.   ? ?Last CBC ?Lab Results  ?Component Value Date  ? WBC 4.9 07/01/2021  ? HGB 16.4 07/01/2021  ? HCT 47.9 07/01/2021  ? MCV 86 07/01/2021  ? MCH 29.5 07/01/2021  ? RDW 13.1 07/01/2021  ? PLT 200 07/01/2021  ? ?Last metabolic panel ?Lab Results  ?Component Value Date  ? GLUCOSE 92  07/01/2021  ? NA 139 07/01/2021  ? K 4.4 07/01/2021  ? CL 101 07/01/2021  ? CO2 24 07/01/2021  ? BUN 10 07/01/2021  ? CREATININE 0.93 07/01/2021  ? EGFR 119 07/01/2021  ? CALCIUM 9.8 07/01/2021  ? PROT 7.1 07/01/2021  ? ALBUMIN 4.9 07/01/2021  ? LABGLOB 2.2 07/01/2021  ? AGRATIO 2.2 07/01/2021  ? BILITOT 0.5 07/01/2021  ? ALKPHOS 103 07/01/2021  ? AST 24 07/01/2021  ? ALT 38 07/01/2021  ? ?Last lipids ?Lab Results  ?Component Value Date  ? CHOL 150 07/01/2021  ? HDL 35 (L) 07/01/2021  ? [place] 89 07/01/2021  ? TRIG 145 07/01/2021  ? CHOLHDL 4.3 07/01/2021  ? ?Last hemoglobin A1c ?Lab Results  ?Component Value Date  ? HGBA1C 5.2 07/01/2021  ? ?Last thyroid functions ?Lab Results  ?Component Value Date  ? TSH 1.960 07/01/2021  ? ?  ? ? Objective  ?  ? ?Today's Vitals  ? 07/07/21 1552  ?BP: 121/69  ?Pulse: 82  ?Temp: 98.1 ?F (36.7 ?C)  ?SpO2: 99%  ?Weight: 245 lb (111.1 kg)  ?Height: 6' 1"  (1.854 m)  ? ?Body mass index is 32.32 kg/m?.  ? ?BP Readings from Last 3 Encounters:  ?07/07/21 121/69  ?05/26/21 104/67  ?04/29/21 (!) 151/91  ?  ?Wt Readings from Last 3 Encounters:  ?07/07/21 245 lb (111.1 kg)  ?05/26/21 215 lb 12.8 oz (97.9 kg)  ?06/07/18 211 lb (95.7 kg) (95 %, Z= 1.69)*  ? ?* Growth percentiles are based on CDC (  Boys, 2-20 Years) data.  ?  ?Physical Exam ?Vitals and nursing note reviewed.  ?Constitutional:   ?   Appearance: Normal appearance. He is well-developed.  ?HENT:  ?   Head: Normocephalic and atraumatic.  ?   Right Ear: Hearing, ear canal and external ear normal. Tympanic membrane is erythematous.  ?   Left Ear: Hearing, ear canal and external ear normal. Tympanic membrane is erythematous.  ?   Nose: Nose normal.  ?   Mouth/Throat:  ?   Mouth: Mucous membranes are moist.  ?   Pharynx: Oropharynx is clear.  ?Eyes:  ?   Extraocular Movements: Extraocular movements intact.  ?   Conjunctiva/sclera: Conjunctivae normal.  ?   Pupils: Pupils are equal, round, and reactive to light.  ?Cardiovascular:  ?    Rate and Rhythm: Normal rate and regular rhythm.  ?   Pulses: Normal pulses.  ?   Heart sounds: Normal heart sounds.  ?Pulmonary:  ?   Effort: Pulmonary effort is normal.  ?   Breath sounds: Normal breath sounds.  ?Abdominal:  ?   General: Bowel sounds are normal. There is no distension.  ?   Palpations: Abdomen is soft. There is no mass.  ?   Tenderness: There is no abdominal tenderness. There is no guarding or rebound.  ?   Hernia: No hernia is present.  ?Musculoskeletal:     ?   General: Normal range of motion.  ?   Cervical back: Normal range of motion and neck supple.  ?Lymphadenopathy:  ?   Cervical: No cervical adenopathy.  ?Skin: ?   General: Skin is warm and dry.  ?   Capillary Refill: Capillary refill takes less than 2 seconds.  ?Neurological:  ?   General: No focal deficit present.  ?   Mental Status: He is alert and oriented to person, place, and time.  ?Psychiatric:     ?   Mood and Affect: Mood normal.     ?   Behavior: Behavior normal.     ?   Thought Content: Thought content normal.     ?   Judgment: Judgment normal.  ?  ? ? Assessment & Plan  ?  ?1. Encounter for general adult medical examination with abnormal findings ?Annual wellness visit today  ? ?2. Gastroesophageal reflux disease without esophagitis ?Improved. May continue prilosec 30m daily as needed. Consider referral to GI I future for worsening symptoms.  ?- omeprazole (PRILOSEC) 40 MG capsule; Take 1 capsule (40 mg total) by mouth daily.  Dispense: 90 capsule; Refill: 3 ? ?3. Body mass index (BMI) of 32.0-32.9 in adult ?Encourage patient to limit calorie intake to 2000 cal/day or less.  He should consume a low cholesterol, low-fat diet.  Recommend he start to incorporate exercise into his daily routine.  ? ?4. Dyslipidemia, goal LDL below 100 ?Recommend patient limit intake of fried and fatty foods. He should increase intake of lean proteins and green leafy vegetables. Adding exercise into daily routine will also be beneficial.   ? ?5.  Need for HPV vaccination ?Thrd HPV vaccine administered during today's visit  ?- HPV 9-valent vaccine,Recombinat ? ?6. Need for Tdap vaccination ?Tdap vaccine administered during today's visit . ?- Tdap vaccine greater than or equal to 7yo IM  ? ? ?Problem List Items Addressed This Visit   ? ?  ? Digestive  ? Gastroesophageal reflux disease without esophagitis  ? Relevant Medications  ? omeprazole (PRILOSEC) 40 MG capsule  ?  ?  Other  ? Body mass index (BMI) of 32.0-32.9 in adult  ? Dyslipidemia, goal LDL below 100  ? ?Other Visit Diagnoses   ? ? Encounter for general adult medical examination with abnormal findings    -  Primary  ? Need for HPV vaccination      ? Relevant Orders  ? HPV 9-valent vaccine,Recombinat (Completed)  ? Need for Tdap vaccination      ? Relevant Orders  ? Tdap vaccine greater than or equal to 7yo IM (Completed)  ? ?  ?  ? ?Return in about 1 year (around 07/08/2022) for health maintenance exam, FBW a week prior to visit.  ?   ? ? ? ? ?Ronnell Freshwater, NP  ?[facility] ?[phone] (phone) ?[phone] (fax) ? ?[facility]  ?

## 2021-07-14 DIAGNOSIS — Z6832 Body mass index (BMI) 32.0-32.9, adult: Secondary | ICD-10-CM | POA: Insufficient documentation

## 2021-07-14 DIAGNOSIS — E785 Hyperlipidemia, unspecified: Secondary | ICD-10-CM | POA: Insufficient documentation

## 2021-08-14 ENCOUNTER — Telehealth: Payer: Self-pay | Admitting: Neurology

## 2021-08-14 ENCOUNTER — Encounter: Payer: Self-pay | Admitting: Neurology

## 2021-08-14 ENCOUNTER — Ambulatory Visit (INDEPENDENT_AMBULATORY_CARE_PROVIDER_SITE_OTHER): Payer: BC Managed Care – PPO | Admitting: Neurology

## 2021-08-14 VITALS — BP 147/82 | HR 69 | Ht 73.0 in | Wt 245.5 lb

## 2021-08-14 DIAGNOSIS — R5383 Other fatigue: Secondary | ICD-10-CM

## 2021-08-14 DIAGNOSIS — R0681 Apnea, not elsewhere classified: Secondary | ICD-10-CM | POA: Diagnosis not present

## 2021-08-14 DIAGNOSIS — G4719 Other hypersomnia: Secondary | ICD-10-CM | POA: Diagnosis not present

## 2021-08-14 DIAGNOSIS — K219 Gastro-esophageal reflux disease without esophagitis: Secondary | ICD-10-CM | POA: Insufficient documentation

## 2021-08-14 DIAGNOSIS — J342 Deviated nasal septum: Secondary | ICD-10-CM

## 2021-08-14 DIAGNOSIS — R0689 Other abnormalities of breathing: Secondary | ICD-10-CM

## 2021-08-14 DIAGNOSIS — J301 Allergic rhinitis due to pollen: Secondary | ICD-10-CM

## 2021-08-14 MED ORDER — FEXOFENADINE HCL 180 MG PO TABS: 180.0000 mg | ORAL_TABLET | Freq: Every day | ORAL | 1 refills | Status: DC

## 2021-08-14 MED ORDER — TRIAMCINOLONE ACETONIDE 55 MCG/ACT NA AERO: 2.0000 | INHALATION_SPRAY | Freq: Every day | NASAL | 12 refills | Status: DC

## 2021-08-14 NOTE — Progress Notes (Signed)
SLEEP MEDICINE CLINIC    Provider:  Melvyn Novas, MD  Primary Care Physician:  Carlean Jews, NP [address] [place] [address]     Referring Provider: Carlean Jews, Np [address] [place],  [place] [address]          Chief Complaint according to patient   Patient presents with:     New Patient (Initial Visit)           HISTORY OF PRESENT ILLNESS:  08-14-2021-  Lucas Erickson is a 21 y.o.  Caucasian male patient seen here as a referral on 08/14/2021 from NP [facility] for a Sleep medicine evaluation.  Chief concern according to patient :  Pt referred for mouth breathing at night, morning HA, trouble waking after sleeping, and brain fog. Pt reports constant fatigue. Some snoring and clearing throat at night. Pt reports never feeling well rested no matter how long he sleeps. Mouth breather . Asthma ?SOB when exercising  Grew up as a passive smoker.  Worked night shift until recently.  No prior PSG.   I have the pleasure of seeing Lucas Erickson today, a right-handed male with a possible sleep disorder and  has a past medical history of Asthma (2011).    Sleep relevant medical history: Nocturia 1 times, sits up in sleep and starts talking, Parasomnia , chronic airway irritation.    Family medical /sleep history: Father with untreated OSA.   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 Tobacco use- none .  ETOH use ; rarely,  Caffeine intake 2-3 a week- in form of Tea. Regular exercise: none .    Sleep habits are as follows: The patient's dinner time is between 3-3.30 PM. and during pauses 10 PM  in his night shift . The patient goes to bed at 7.30 AM and can get 7 hours, waking up at 3 PM . The preferred sleep position is variable , with the support of 2 pillows.  Dreams are reportedly rare.    The patient wakes up spontaneously at 3 Pm with an alarm.  He  reports not feeling refreshed or restored in AM, with symptoms such as dry mouth, morning headaches, and residual fatigue.  Naps are not taken. He works 12 hour shift.    Review of Systems: Out of a complete 14 system review, the patient complains of only the following symptoms, and all other reviewed systems are negative.:  Fatigue, sleepiness , snoring,  naps cause more headaches, 12 hour night shifts, 4 a week.   GERD. Choking, clearing throat after each meal.   eczema   How likely are you to doze in the following situations: 0 = not likely, 1 = slight chance, 2 = moderate chance, 3 = high chance   Sitting and Reading? Watching Television? Sitting inactive in a public place (theater or meeting)? As a passenger in a car for an hour without a break? Lying down in the afternoon when circumstances permit? Sitting and talking to someone? Sitting quietly after lunch without alcohol? In a car, while stopped for a few minutes in traffic?   Total = 15/ 24 points   FSS endorsed at 44/ 63 points.     Social History   Socioeconomic History   Marital status: Single    Spouse name: Not on file   Number of children: Not  on file   Years of education: Not on file   Highest education level: Associate degree: occupational, Scientist, product/process developmenttechnical, or vocational program  Occupational History   Not on file  Tobacco Use   Smoking status: Never    Passive exposure: Yes   Smokeless tobacco: Never  Vaping Use   Vaping Use: Never used  Substance and Sexual Activity   Alcohol use: Never   Drug use: No   Sexual activity: Yes    Birth control/protection: Condom  Other Topics Concern   Not on file  Social History Narrative   Lives alone   R handed   Caffeine: 50mg  a day   Social Determinants of Health   Financial Resource Strain: Not on file  Food Insecurity: Not on file  Transportation Needs: Not on file  Physical Activity: Not on file  Stress: Not on file  Social Connections: Not on file     Family History  Problem Relation Age of Onset   Diabetes Father        pre-diabetic   Hypertension Paternal Grandmother     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     Current Outpatient Medications on File Prior to Visit  Medication Sig Dispense Refill   omeprazole (PRILOSEC) 40 MG capsule Take 1 capsule (40 mg total) by mouth daily. 90 capsule 3   No current facility-administered medications on file prior to visit.   Spring- hay fever Physical exam:  Today's Vitals   08/14/21 0838  BP: (!) 147/82  Pulse: 69  Weight: 245 lb 8 oz (111.4 kg)  Height: 6\' 1"  (1.854 m)   Body mass index is 32.39 kg/m.   Wt Readings from Last 3 Encounters:  08/14/21 245 lb 8 oz (111.4 kg)  07/07/21 245 lb (111.1 kg)  05/26/21 215 lb 12.8 oz (97.9 kg)     Ht Readings from Last 3 Encounters:  08/14/21 6\' 1"  (1.854 m)  07/07/21 6\' 1"  (1.854 m)  05/26/21 6\' 1"  (1.854 m)      General: The patient is awake, alert and appears not in acute distress. The patient is well groomed. Head: Normocephalic, atraumatic. Neck is supple. Mallampati 3,  tongue buckles.  neck circumference:16. 75 inches . Nasal airflow congested    Retrognathia is seen.  Dental status:  biological.  Wore braces. Irregular teeth, small oral opening.  Cardiovascular:  Regular rate and cardiac rhythm by pulse,  without distended neck veins. Respiratory: Lungs are clear to auscultation.  Skin:  Without evidence of ankle edema, or rash. Trunk: The patient's posture is erect.   Neurologic exam : The patient is awake and alert, oriented to place and time.   Memory subjective described as intact.  Attention span & concentration ability appears normal.  Speech is fluent,  without dysarthria, dysphonia or aphasia.  Mood and affect are appropriate.   Cranial nerves: no loss of smell or  taste reported  Pupils are equal and briskly reactive to light. Funduscopic exam deferred. .  Extraocular movements in vertical and horizontal planes were intact and without nystagmus. No Diplopia. Visual fields by finger perimetry are intact. Hearing was intact to soft voice and finger rubbing.    Facial sensation intact to fine touch.  Facial motor strength is symmetric and tongue and uvula move midline.  Neck ROM : rotation, tilt and flexion  extension were normal for age and shoulder shrug was symmetrical.    Motor exam:  Symmetric bulk, tone and ROM.   Normal tone without cog -wheeling, symmetric grip strength .   Sensory:  Fine touch, pinprick and vibration were tested  and  normal.  Proprioception tested in the upper extremities was normal.   Coordination: Rapid alternating movements in the fingers/hands were of normal speed.  The Finger-to-nose maneuver was intact without evidence of ataxia, dysmetria or tremor.   Gait and station: Patient could rise unassisted from a seated position, walked without assistive device.  Stance is of normal width/ base and the patient turned with 3 steps.  Status post hip surgery in 2021.  Bone cyst-  Toe and heel walk were deferred.  Deep tendon reflexes: in the upper and lower extremities are symmetric and intact.  Babinski response was deferred.      After spending a total time of  40  minutes face to face and additional time for physical and neurologic examination, review of laboratory studies,  personal review of imaging studies, reports and results of other testing and review of referral information / records as far as provided in visit, I have established the following assessments:  1) EDS - excessive daytime sleepiness, high degree of fatigue. GF has stated he is not a snorer!   2) Higher risk for sleep apnea due to congested nasal airflow, small oral opening, teeth irregular. BMI elevated . Airway is small. Also history of GERD, Asthma and  allergies.   There is a minor septal deviation, left side smaller. Marland Kitchen   3) shift work sleep disorder-    My Plan is to proceed with:  1) PSG , screen for apnea and nasal airflow.  2) may be treated with dental device . 3) start to treat GERD and allergies/ Claritin and nasal spray at night.   4) Sinus CT scan ordered.  I would like to thank Carlean Jews, NP and Carlean Jews, Np [address] [place],  [place] [address] for allowing me to meet with and to take care of this pleasant patient.   In short, Lucas Erickson is presenting with non restorative sleep and EDS, as a shift Financial controller.  I plan to follow up either personally or through our NP within 2-4  months.   CC: I will share my notes with PCP .  Electronically signed by: Melvyn Novas, MD 08/14/2021 9:20 AM  [facility] Board certified by The [employer] of Sleep Medicine and Diplomate of the [employer] of Sleep Medicine. Board certified In Neurology through the ABPN, Fellow of the [employer] of Neurology. Medical Director of [facility].

## 2021-08-14 NOTE — Telephone Encounter (Signed)
A referral to ENT will be placed for the pt to further evaluate since unable to get coverage ?

## 2021-08-14 NOTE — Patient Instructions (Signed)
Screening for Sleep Apnea  Sleep apnea is a condition in which breathing pauses or becomes shallow during sleep. Sleep apnea screening is a test to determine if you are at risk for sleep apnea. The test includes a series of questions. It will only takes a few minutes. Your health care provider may ask you to have this test in preparation for surgery or as part of a physical exam. What are the symptoms of sleep apnea? Common symptoms of sleep apnea include: Snoring. Waking up often at night. Daytime sleepiness. Pauses in breathing. Choking or gasping during sleep. Irritability. Forgetfulness. Trouble thinking clearly. Depression. Personality changes. Most people with sleep apnea do not know that they have it. What are the advantages of sleep apnea screening? Getting screened for sleep apnea can help: Ensure your safety. It is important for your health care providers to know whether or not you have sleep apnea, especially if you are having surgery or have other long-term (chronic) health conditions. Improve your health and allow you to get a better night's rest. Restful sleep can help you: Have more energy. Lose weight. Improve high blood pressure. Improve diabetes management. Prevent stroke. Prevent car accidents. What happens during the screening? Screening usually includes being asked a list of questions about your sleep quality. Some questions you may be asked include: Do you snore? Is your sleep restless? Do you have daytime sleepiness? Has a partner or spouse told you that you stop breathing during sleep? Have you had trouble concentrating or memory loss? What is your age? What is your neck circumference? To measure your neck, keep your back straight and gently wrap the tape measure around your neck. Put the tape measure at the middle of your neck, between your chin and collarbone. What is your sex assigned at birth? Do you have or are you being treated for high blood  pressure? If your screening test is positive, you are at risk for the condition. Further testing may be needed to confirm a diagnosis of sleep apnea. Where to find more information You can find screening tools online or at your health care clinic. For more information about sleep apnea screening and healthy sleep, visit these websites: Centers for Disease Control and Prevention: www.cdc.gov American Sleep Apnea Association: www.sleepapnea.org Contact a health care provider if: You think that you may have sleep apnea. Summary Sleep apnea screening can help determine if you are at risk for sleep apnea. It is important for your health care providers to know whether or not you have sleep apnea, especially if you are having surgery or have other chronic health conditions. You may be asked to take a screening test for sleep apnea in preparation for surgery or as part of a physical exam. This information is not intended to replace advice given to you by your health care provider. Make sure you discuss any questions you have with your health care provider. Document Revised: 03/15/2020 Document Reviewed: 03/15/2020 Elsevier Patient Education  2023 Elsevier Inc.  

## 2021-08-14 NOTE — Telephone Encounter (Signed)
I am not able to approve the CT Maxillofacial, would you like to do a peer to peer or put a referral in for  ENT?  ?

## 2021-08-18 NOTE — Telephone Encounter (Signed)
Sent to [facility] ph # [phone] °

## 2021-09-10 ENCOUNTER — Encounter: Payer: Self-pay | Admitting: Neurology

## 2021-09-24 NOTE — Telephone Encounter (Signed)
[employer] Berkley Harvey: [id number] (exp. 09/16/21 to 11/14/21) patient is scheduled at [facility] for 2:00 pm

## 2021-10-07 ENCOUNTER — Ambulatory Visit: Payer: BC Managed Care – PPO | Admitting: Neurology

## 2021-10-07 DIAGNOSIS — R0689 Other abnormalities of breathing: Secondary | ICD-10-CM

## 2021-10-07 DIAGNOSIS — J301 Allergic rhinitis due to pollen: Secondary | ICD-10-CM

## 2021-10-07 DIAGNOSIS — G4710 Hypersomnia, unspecified: Secondary | ICD-10-CM | POA: Diagnosis not present

## 2021-10-07 DIAGNOSIS — R5383 Other fatigue: Secondary | ICD-10-CM

## 2021-10-07 DIAGNOSIS — J342 Deviated nasal septum: Secondary | ICD-10-CM

## 2021-10-07 DIAGNOSIS — K219 Gastro-esophageal reflux disease without esophagitis: Secondary | ICD-10-CM

## 2021-10-07 DIAGNOSIS — G4719 Other hypersomnia: Secondary | ICD-10-CM

## 2021-10-15 NOTE — Procedures (Signed)
[facility] TEST REPORT ( by Watch PAT)  Patrecia Pour STUDY DATE:  10-09-2021   ORDERING CLINICIAN: Larey Seat, MD  REFERRING CLINICIAN:  NP Junius Creamer  CLINICAL INFORMATION/HISTORY: Lucas Erickson is a 21 y.o. male patient and was seen on 08/14/2021 upon ref. from NP [facility] for a Sleep medicine evaluation.  Chief concern according to patient :  Pt referred for mouth breathing at night, morning HA, trouble waking after sleeping, and brain fog. Pt reports constant fatigue. Some snoring and clearing throat at night. Pt reports never feeling well rested no matter how long he sleeps. Asthma ? SOB when exercising .Grew up as a passive smoker. Sinus pressure, Retrognathia, GERD, sleep choking sensations reported.   Worked night shift until recently.     Sleep relevant medical history: "sits up in his sleep and starts talking", GF stated he does not snore. Parasomnia , chronic airway irritation. More headaches when he naps. Family medical /sleep history: Father with untreated OSA.     Epworth sleepiness score: 15/24. FSS at 44/ 63 points.    BMI: 32.4 kg/m   Neck Circumference: 17"   FINDINGS:   Sleep Summary:   Total Recording Time (hours, min): Total recording time amounted to 7 hours and 27 minutes of which the total sleep time was calculated to be 6 hours and 40 minutes.  REM sleep proportion was 26%.                                 Respiratory Indices:   Calculated pAHI (per hour): 1.2/h                           REM pAHI:   1.2/h                                              NREM pAHI:   1.2/h                           The highest AHI was briefly seen during left lateral sleep at 4.2/h still clinically insignificant.  Snoring reached a mean volume of 40 dB which is the threshold to be registered.  Mild snoring may accompany up to 18% of total sleep time.  AHI:                                                   Oxygen Saturation Statistics:    Oxygen Saturation (%) Mean:   95%            O2 Saturation Range (%):   Between a nadir at 93% and a maximum saturation at 99%                                    O2 Saturation (minutes) <89%:   0 minutes        Pulse Rate Statistics:   Pulse Mean (bpm): 70 bpm  Pulse Range: Between 50 and 118 bpm               IMPRESSION:  This HST did not confirm the presence of sleep apnea and therefore sleep disordered breathing cannot be a reason for the patient's high degree of daytime sleepiness.  There was a high proportion of REM sleep noted.  It would be worth for this patient to follow-up with an attended sleep study looking for early REM onset followed by an MSLT   RECOMMENDATION: Sleep disordered breathing cannot be the cause for the patient's high degree of daytime sleepiness.  There was a high proportion of REM sleep noted. This patient had reported not recalling if he dreams at all.   If this patient presents with a positive HLA test for narcolepsy, it would be worth for this patient to follow-up with an attended sleep study looking for early REM onset followed by an MSLT.  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      INTERPRETING PHYSICIAN:   Larey Seat, MD   Medical Director of [facility] at [facility].

## 2021-10-15 NOTE — Progress Notes (Signed)
Please contact patient to schedule a follow up regarding his sleep study results. It does not have to be urgent visit. Thanks so much.   -HB

## 2021-10-16 ENCOUNTER — Encounter: Payer: Self-pay | Admitting: Neurology

## 2021-10-16 ENCOUNTER — Telehealth: Payer: Self-pay | Admitting: Neurology

## 2021-10-16 DIAGNOSIS — L308 Other specified dermatitis: Secondary | ICD-10-CM | POA: Diagnosis not present

## 2021-10-16 NOTE — Telephone Encounter (Signed)
-----  Message from Larey Seat, MD sent at 10/15/2021  1:25 PM EDT ----- IMPRESSION:  This HST did not confirm the presence of sleep apnea and therefore sleep disordered breathing cannot be a reason for the patient's high degree of daytime sleepiness.  There was a high proportion of REM sleep noted.  It would be worth for this patient to follow-up with an attended sleep study looking for early REM onset followed by an MSLT   RECOMMENDATION: Sleep disordered breathing cannot be the cause for the patient's high degree of daytime sleepiness.  There was a high proportion of REM sleep noted. This patient had reported not recalling if he dreams at all.   If this patient presents with a positive HLA test for narcolepsy, it would be worth for this patient to follow-up with an attended sleep study looking for early REM onset followed by an MSLT.  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

## 2021-10-16 NOTE — Telephone Encounter (Signed)
Called patient to discuss sleep study results. No answer at this time. LVM for the patient to call back.  Will send a mychart message as well. 

## 2021-10-20 ENCOUNTER — Other Ambulatory Visit (INDEPENDENT_AMBULATORY_CARE_PROVIDER_SITE_OTHER): Payer: Self-pay

## 2021-10-20 ENCOUNTER — Other Ambulatory Visit: Payer: Self-pay | Admitting: *Deleted

## 2021-10-20 DIAGNOSIS — G47419 Narcolepsy without cataplexy: Secondary | ICD-10-CM

## 2021-10-20 DIAGNOSIS — Z0289 Encounter for other administrative examinations: Secondary | ICD-10-CM

## 2021-10-20 DIAGNOSIS — G4719 Other hypersomnia: Secondary | ICD-10-CM | POA: Diagnosis not present

## 2021-10-20 DIAGNOSIS — R5383 Other fatigue: Secondary | ICD-10-CM | POA: Diagnosis not present

## 2021-10-22 ENCOUNTER — Other Ambulatory Visit: Payer: Self-pay | Admitting: Neurology

## 2021-10-22 MED ORDER — MODAFINIL 200 MG PO TABS
100.0000 mg | ORAL_TABLET | Freq: Every day | ORAL | 5 refills | Status: DC
Start: 2021-10-22 — End: 2021-10-23

## 2021-10-22 NOTE — Telephone Encounter (Signed)
Faxed printed/signed rx modafinil to pharmacy at [phone]. Received fax confirmation.

## 2021-10-22 NOTE — Telephone Encounter (Signed)
PA completed on [employer] for modafinil GPQ:[id number]  Will await determination

## 2021-10-23 ENCOUNTER — Telehealth: Payer: Self-pay | Admitting: Neurology

## 2021-10-23 MED ORDER — ARMODAFINIL 250 MG PO TABS: 125.0000 mg | ORAL_TABLET | Freq: Every day | ORAL | 5 refills | Status: DC

## 2021-10-23 NOTE — Addendum Note (Signed)
Addended by: Melvyn Novas on: 10/23/2021 03:24 PM   Modules accepted: Orders

## 2021-10-23 NOTE — Telephone Encounter (Signed)
PA for modafinil was denied. His insurance requires trying armodafinil first.

## 2021-10-23 NOTE — Telephone Encounter (Signed)
PA for modafinil was denied stating must try armodafinil first. PA initiated for Armodafinil   PA completed through [employer] KEY: [id number]  Will await determination

## 2021-10-23 NOTE — Addendum Note (Signed)
Addended by: Judi Cong on: 10/23/2021 03:08 PM   Modules accepted: Orders

## 2021-10-30 LAB — NARCOLEPSY EVALUATION
DQA1*01:02: POSITIVE
DQB1*06:02: POSITIVE

## 2021-10-30 NOTE — Progress Notes (Signed)
Patient needs PSG &MSLT TO FOLLOW- WHEN APPOITMENT FOR TEST IS MADE, ADVISE THAT HE CAN'T USE MODAFINIL FOR 10 DAYS PRIOR TO TEST DATE.

## 2021-10-30 NOTE — Progress Notes (Signed)
Positive for 2 alleles of narcolepsy associated  HLA. Patient has undergone ENT evaluation and home sleep study, negative for apnea . Epworth sleepiness score of 15/ 24.   This patient will need a full narcolepsy work up including PSG and  MSLT  Please prepare patient for medication wean if needed.

## 2021-11-26 NOTE — Progress Notes (Unsigned)
Established patient visit   Patient: Lucas Erickson   DOB: [birth year]   21 y.o. Male  MRN: [id number] Visit Date: 11/27/2021   No chief complaint on file.  Subjective    HPI  Follow up visit -takes prilosec daily for GERD -would like to have labs to check food allergies.  -states that eating certain things make him feel bad with "stomach issues."  --occurs most frequently with consumption of bread and carbohydrates  --states that stomach issues include some intestinal cramping and diarrhea  --notes that dairy foods make him feel congested and cause him to have to clear his throat often.  -has stopped taking prilosec as he hadn't noted it making a big difference.  -states that he is trying to eat a bit better and this does help with scid reflux type symptoms.  -he has lost 22 pounds since he was last seen and dietary changes have been made.    Medications: Outpatient Medications Prior to Visit  Medication Sig   Armodafinil 250 MG tablet Take 0.5-1 tablets (125-250 mg total) by mouth daily. Start by taking 1/2 tablet in morning, if tolerates or feels the additional 1/2 tablet is needed may increase to full tablet in the morning.   fexofenadine (ALLEGRA) 180 MG tablet Take 1 tablet (180 mg total) by mouth daily.   omeprazole (PRILOSEC) 40 MG capsule Take 1 capsule (40 mg total) by mouth daily.   triamcinolone (NASACORT ALLERGY 24HR) 55 MCG/ACT AERO nasal inhaler Place 2 sprays into the nose daily.   No facility-administered medications prior to visit.    Review of Systems  Constitutional:  Positive for fatigue. Negative for activity change, chills and fever.       23 pounds since most recent visit   HENT:  Positive for congestion. Negative for postnasal drip, rhinorrhea, sinus pressure, sinus pain, sneezing and sore throat.   Eyes: Negative.   Respiratory:  Negative for cough, shortness of breath and wheezing.   Cardiovascular:  Negative for chest pain and palpitations.   Gastrointestinal:  Positive for diarrhea. Negative for constipation, nausea and vomiting.       Bloating and intestinal cramping   Endocrine: Negative for cold intolerance, heat intolerance, polydipsia and polyuria.  Genitourinary:  Negative for dysuria, frequency and urgency.  Musculoskeletal:  Negative for back pain and myalgias.  Skin:  Negative for rash.  Allergic/Immunologic: Positive for food allergies. Negative for environmental allergies.  Neurological:  Negative for dizziness, weakness and headaches.  Psychiatric/Behavioral:  The patient is not nervous/anxious.        Objective     Today's Vitals   11/27/21 1308  BP: 110/69  Pulse: 76  SpO2: 100%  Weight: 222 lb (100.7 kg)  Height: 6' 0.84" (1.85 m)   Body mass index is 29.42 kg/m.   BP Readings from Last 3 Encounters:  11/27/21 110/69  08/14/21 (!) 147/82  07/07/21 121/69    Wt Readings from Last 3 Encounters:  11/27/21 222 lb (100.7 kg)  08/14/21 245 lb 8 oz (111.4 kg)  07/07/21 245 lb (111.1 kg)    Physical Exam Vitals and nursing note reviewed.  Constitutional:      Appearance: Normal appearance. He is well-developed.  HENT:     Head: Normocephalic and atraumatic.     Nose: Nose normal.     Mouth/Throat:     Mouth: Mucous membranes are moist.     Pharynx: Oropharynx is clear.  Eyes:     Conjunctiva/sclera: Conjunctivae normal.  Pupils: Pupils are equal, round, and reactive to light.  Cardiovascular:     Rate and Rhythm: Normal rate and regular rhythm.     Pulses: Normal pulses.     Heart sounds: Normal heart sounds.  Pulmonary:     Effort: Pulmonary effort is normal.     Breath sounds: Normal breath sounds.  Abdominal:     General: Bowel sounds are normal. There is no distension.     Palpations: Abdomen is soft. There is no mass.     Tenderness: There is no abdominal tenderness. There is no right CVA tenderness, left CVA tenderness, guarding or rebound.     Hernia: No hernia is present.   Musculoskeletal:        General: Normal range of motion.     Cervical back: Normal range of motion and neck supple.  Lymphadenopathy:     Cervical: No cervical adenopathy.  Skin:    General: Skin is warm and dry.     Capillary Refill: Capillary refill takes less than 2 seconds.  Neurological:     General: No focal deficit present.     Mental Status: He is alert and oriented to person, place, and time.  Psychiatric:        Mood and Affect: Mood normal.        Behavior: Behavior normal.        Thought Content: Thought content normal.        Judgment: Judgment normal.       Assessment & Plan    1. Diarrhea, unspecified type Unclear etiology, but most severe after eating wheat, carbs, or dairy products. Add dicyclomine 20 mg up to twice daily to reduce intestinal cramps and diarrhea. Will test labs for food allergy and celiac disease.  - Celiac Panel; Future - dicyclomine (BENTYL) 20 MG tablet; Take 1 tablet (20 mg total) by mouth 2 (two) times daily as needed for spasms.  Dispense: 45 tablet; Refill: 1  2. Food allergy GI symptoms after eating wheat, dairy, or carbs. Check food allergy panel and celiac disease markers.  - Allergen Profile, Basic Food; Future - Celiac Panel; Future  3. Gastroesophageal reflux disease without esophagitis GI symptoms after eating wheat, dairy, or carbs. Check food allergy panel and celiac disease markers.  - Allergen Profile, Basic Food; Future - Celiac Panel; Future  4. Fatigue, unspecified type Check thyroid panel and CBC for further evaluation - TSH + free T4; Future - CBC; Future  5. Weight loss Check thyroid panel and CBC for further evaluation - TSH + free T4; Future - CBC; Future   Problem List Items Addressed This Visit       Digestive   Gastroesophageal reflux disease without esophagitis   Relevant Medications   dicyclomine (BENTYL) 20 MG tablet   Other Relevant Orders   Allergen Profile, Basic Food   Celiac Panel      Other   Fatigue   Relevant Orders   TSH + free T4   CBC   Diarrhea - Primary   Relevant Medications   dicyclomine (BENTYL) 20 MG tablet   Other Relevant Orders   Celiac Panel   Food allergy   Relevant Orders   Allergen Profile, Basic Food   Celiac Panel   Weight loss   Relevant Orders   TSH + free T4   CBC     Return in about 3 weeks (around 12/18/2021) for review labs, ?referral to immunology  - lab appt. needed in next few days.  already ordered .         Carlean Jews, NP  [facility] at [facility] [phone] (phone) [phone] (fax)  [facility]

## 2021-11-27 ENCOUNTER — Encounter: Payer: Self-pay | Admitting: Nurse Practitioner

## 2021-11-27 ENCOUNTER — Ambulatory Visit (INDEPENDENT_AMBULATORY_CARE_PROVIDER_SITE_OTHER): Payer: BC Managed Care – PPO | Admitting: Nurse Practitioner

## 2021-11-27 VITALS — BP 110/69 | HR 76 | Ht 72.84 in | Wt 222.0 lb

## 2021-11-27 DIAGNOSIS — Z91018 Allergy to other foods: Secondary | ICD-10-CM | POA: Diagnosis not present

## 2021-11-27 DIAGNOSIS — R634 Abnormal weight loss: Secondary | ICD-10-CM

## 2021-11-27 DIAGNOSIS — R197 Diarrhea, unspecified: Secondary | ICD-10-CM

## 2021-11-27 DIAGNOSIS — R5383 Other fatigue: Secondary | ICD-10-CM

## 2021-11-27 DIAGNOSIS — K219 Gastro-esophageal reflux disease without esophagitis: Secondary | ICD-10-CM | POA: Diagnosis not present

## 2021-11-27 MED ORDER — DICYCLOMINE HCL 20 MG PO TABS: 20.0000 mg | ORAL_TABLET | Freq: Two times a day (BID) | ORAL | 1 refills | Status: DC | PRN

## 2021-11-28 ENCOUNTER — Other Ambulatory Visit: Payer: BC Managed Care – PPO

## 2021-11-28 DIAGNOSIS — K219 Gastro-esophageal reflux disease without esophagitis: Secondary | ICD-10-CM

## 2021-11-28 DIAGNOSIS — R197 Diarrhea, unspecified: Secondary | ICD-10-CM | POA: Diagnosis not present

## 2021-11-28 DIAGNOSIS — R5383 Other fatigue: Secondary | ICD-10-CM | POA: Diagnosis not present

## 2021-11-28 DIAGNOSIS — R634 Abnormal weight loss: Secondary | ICD-10-CM

## 2021-11-28 DIAGNOSIS — Z91018 Allergy to other foods: Secondary | ICD-10-CM | POA: Diagnosis not present

## 2021-11-30 NOTE — Progress Notes (Signed)
Waiting on allergy panels to result

## 2021-12-01 NOTE — Progress Notes (Signed)
Negative gluten/celiac panel. Waiting for other allergy panel

## 2021-12-02 LAB — CBC
Hematocrit: 44 % (ref 37.5–51.0)
Hemoglobin: 15.1 g/dL (ref 13.0–17.7)
MCH: 29.8 pg (ref 26.6–33.0)
MCHC: 34.3 g/dL (ref 31.5–35.7)
MCV: 87 fL (ref 79–97)
Platelets: 192 10*3/uL (ref 150–450)
RBC: 5.06 x10E6/uL (ref 4.14–5.80)
RDW: 13.6 % (ref 11.6–15.4)
WBC: 5.2 10*3/uL (ref 3.4–10.8)

## 2021-12-02 LAB — ALLERGEN PROFILE, BASIC FOOD
Allergen Corn, IgE: <0.1 kU/L
Beef IgE: 0.38 kU/L — AB
Chocolate/Cacao IgE: <0.1 kU/L
Egg, Whole IgE: <0.1 kU/L
Food Mix (Seafoods) IgE: NEGATIVE
Milk IgE: 0.18 kU/L — AB
Peanut IgE: <0.1 kU/L
Pork IgE: 0.31 kU/L — AB
Soybean IgE: <0.1 kU/L
Wheat IgE: <0.1 kU/L

## 2021-12-02 LAB — GLIA (IGA/G) + TTG IGA
Antigliadin Abs, IgA: 6 units (ref 0–19)
Gliadin IgG: 3 units (ref 0–19)
Transglutaminase IgA: <2 U/mL (ref 0–3)

## 2021-12-02 LAB — TSH+FREE T4
Free T4: 1.4 ng/dL (ref 0.82–1.77)
TSH: 3.36 u[IU]/mL (ref 0.450–4.500)

## 2021-12-18 ENCOUNTER — Encounter: Payer: Self-pay | Admitting: Nurse Practitioner

## 2021-12-18 ENCOUNTER — Ambulatory Visit (INDEPENDENT_AMBULATORY_CARE_PROVIDER_SITE_OTHER): Payer: BC Managed Care – PPO | Admitting: Nurse Practitioner

## 2021-12-18 VITALS — BP 108/68 | HR 70 | Ht 72.84 in | Wt 217.1 lb

## 2021-12-18 DIAGNOSIS — R12 Heartburn: Secondary | ICD-10-CM | POA: Insufficient documentation

## 2021-12-18 DIAGNOSIS — K219 Gastro-esophageal reflux disease without esophagitis: Secondary | ICD-10-CM

## 2021-12-18 DIAGNOSIS — Z91018 Allergy to other foods: Secondary | ICD-10-CM

## 2021-12-18 DIAGNOSIS — R197 Diarrhea, unspecified: Secondary | ICD-10-CM | POA: Diagnosis not present

## 2021-12-18 MED ORDER — FAMOTIDINE 20 MG PO TABS: 20.0000 mg | ORAL_TABLET | Freq: Two times a day (BID) | ORAL | 2 refills | Status: DC

## 2021-12-18 NOTE — Progress Notes (Signed)
Established patient visit   Patient: Lucas Erickson   DOB: [birth year]   21 y.o. Male  MRN: [id number] Visit Date: 12/18/2021   Chief Complaint  Patient presents with   Follow-up   Subjective    HPI  Follow up visit.  -increased "stomach issues" -intestinal cramping along with diarrhea.  -gets bloated very quickly after eating most of the time.  -increased episodes of GERD and heart burn. -increased gas.  -states that taking dicyclomine does help him some but is afraid to take too often as medication causes a bit of constipation.  -labs done since his last visit show mild to moderate allergy to beef and pork.  -mild allergy to milk.  -all other labs were normal.  -has lost additional 5 pounds since his last visit    Medications: Outpatient Medications Prior to Visit  Medication Sig   Armodafinil 250 MG tablet Take 0.5-1 tablets (125-250 mg total) by mouth daily. Start by taking 1/2 tablet in morning, if tolerates or feels the additional 1/2 tablet is needed may increase to full tablet in the morning.   dicyclomine (BENTYL) 20 MG tablet Take 1 tablet (20 mg total) by mouth 2 (two) times daily as needed for spasms.   fexofenadine (ALLEGRA) 180 MG tablet Take 1 tablet (180 mg total) by mouth daily.   omeprazole (PRILOSEC) 40 MG capsule Take 1 capsule (40 mg total) by mouth daily.   triamcinolone (NASACORT ALLERGY 24HR) 55 MCG/ACT AERO nasal inhaler Place 2 sprays into the nose daily.   No facility-administered medications prior to visit.    Review of Systems  Constitutional:  Positive for appetite change and fatigue. Negative for activity change, chills and fever.  HENT:  Negative for congestion, postnasal drip, rhinorrhea, sinus pressure, sinus pain, sneezing and sore throat.   Eyes: Negative.   Respiratory:  Negative for cough, shortness of breath and wheezing.   Cardiovascular:  Negative for chest pain and palpitations.  Gastrointestinal:  Positive for diarrhea and nausea.  Negative for constipation and vomiting.       Increased bloating, heartburn, and gas.   Endocrine: Negative for cold intolerance, heat intolerance, polydipsia and polyuria.  Genitourinary:  Negative for dysuria, frequency and urgency.  Musculoskeletal:  Negative for back pain and myalgias.  Skin:  Negative for rash.  Allergic/Immunologic: Positive for food allergies. Negative for environmental allergies.  Neurological:  Negative for dizziness, weakness and headaches.  Psychiatric/Behavioral:  The patient is not nervous/anxious.        Objective     Today's Vitals   12/18/21 1558  BP: 108/68  Pulse: 70  SpO2: 98%  Weight: 217 lb 1.9 oz (98.5 kg)  Height: 6' 0.84" (1.85 m)   Body mass index is 28.77 kg/m.   BP Readings from Last 3 Encounters:  12/18/21 108/68  11/27/21 110/69  08/14/21 (!) 147/82    Wt Readings from Last 3 Encounters:  12/18/21 217 lb 1.9 oz (98.5 kg)  11/27/21 222 lb (100.7 kg)  08/14/21 245 lb 8 oz (111.4 kg)    Physical Exam Vitals and nursing note reviewed.  Constitutional:      Appearance: Normal appearance. He is well-developed.  HENT:     Head: Normocephalic and atraumatic.     Nose: Nose normal.     Mouth/Throat:     Mouth: Mucous membranes are moist.     Pharynx: Oropharynx is clear.  Eyes:     Conjunctiva/sclera: Conjunctivae normal.     Pupils: Pupils are equal, round,  and reactive to light.  Cardiovascular:     Rate and Rhythm: Normal rate and regular rhythm.     Pulses: Normal pulses.     Heart sounds: Normal heart sounds.  Pulmonary:     Effort: Pulmonary effort is normal.     Breath sounds: Normal breath sounds.  Abdominal:     General: Bowel sounds are normal. There is no distension.     Palpations: Abdomen is soft. There is no mass.     Tenderness: There is no abdominal tenderness. There is no right CVA tenderness, left CVA tenderness, guarding or rebound.     Hernia: No hernia is present.  Musculoskeletal:        General:  Normal range of motion.     Cervical back: Normal range of motion and neck supple.  Lymphadenopathy:     Cervical: No cervical adenopathy.  Skin:    General: Skin is warm and dry.     Capillary Refill: Capillary refill takes less than 2 seconds.  Neurological:     General: No focal deficit present.     Mental Status: He is alert and oriented to person, place, and time.  Psychiatric:        Mood and Affect: Mood normal.        Behavior: Behavior normal.        Thought Content: Thought content normal.        Judgment: Judgment normal.       Assessment & Plan    1. Diarrhea, unspecified type This has worsened since last visit. Suspect relationship with food allergy. Continue to take dicyclomine as needed and as prescribed. Refer to GI for further evaluation.  - Ambulatory referral to Gastroenterology  2. Heartburn Add pepcid 20 mg twice daily. Refer to GI for further evaluation.  - famotidine (PEPCID) 20 MG tablet; Take 1 tablet (20 mg total) by mouth 2 (two) times daily.  Dispense: 60 tablet; Refill: 2 - Ambulatory referral to Gastroenterology  3. Gastroesophageal reflux disease without esophagitis Add pepcid 20 mg twice daily. Refer to GI for further evaluation.  - Ambulatory referral to Gastroenterology  4. 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 Suspicious for Alpha Gal syndrome.  Will get additional testing for confirmation. Encouraged him to avoid these food products.  Add pepcid twice daily. Take dicyclomine as needed and as prescribed. Refer to GI for further evaluation.  - Ambulatory referral to Gastroenterology   Problem List Items Addressed This Visit       Digestive   Gastroesophageal reflux disease without esophagitis   Relevant Medications   famotidine (PEPCID) 20 MG tablet   Other Relevant Orders   Ambulatory referral to Gastroenterology     Other   Diarrhea - Primary   Relevant Orders   Ambulatory referral to  Gastroenterology   Food allergy   Relevant Orders   Ambulatory referral to Gastroenterology   Heartburn   Relevant Medications   famotidine (PEPCID) 20 MG tablet   Other Relevant Orders   Ambulatory referral to Gastroenterology     Return in about 6 months (around 06/18/2022) for health maintenance exam. needs lab appointment soon to test for Alpha gal sydrome. Marland Kitchen         Carlean Jews, NP  [facility] at [facility] [phone] (phone) [phone] (fax)  [facility]

## 2021-12-19 ENCOUNTER — Other Ambulatory Visit: Payer: BC Managed Care – PPO

## 2021-12-19 DIAGNOSIS — Z91018 Allergy to other foods: Secondary | ICD-10-CM

## 2021-12-19 DIAGNOSIS — R197 Diarrhea, unspecified: Secondary | ICD-10-CM | POA: Diagnosis not present

## 2021-12-22 LAB — ALPHA-GAL PANEL
Allergen Lamb IgE: 0.47 kU/L — AB
Beef IgE: 0.47 kU/L — AB
IgE (Immunoglobulin E), Serum: 29 IU/mL (ref 6–495)
O215-IgE Alpha-Gal: 0.6 kU/L — AB
Pork IgE: 0.39 kU/L — AB

## 2021-12-22 NOTE — Progress Notes (Signed)
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 Like we talked about during his visit, he needs to avoid pork and beef intake as much as possible.  He should also keep referral appointment with GI. Thanks  -HB

## 2022-04-30 ENCOUNTER — Ambulatory Visit: Payer: BC Managed Care – PPO | Admitting: Gastroenterology

## 2022-08-20 ENCOUNTER — Ambulatory Visit: Payer: BC Managed Care – PPO | Admitting: Nurse Practitioner

## 2022-09-08 ENCOUNTER — Ambulatory Visit: Payer: BC Managed Care – PPO | Admitting: Nurse Practitioner

## 2022-09-09 ENCOUNTER — Encounter: Payer: Self-pay | Admitting: Nurse Practitioner

## 2022-09-09 ENCOUNTER — Ambulatory Visit: Payer: BC Managed Care – PPO | Admitting: Nurse Practitioner

## 2022-09-09 VITALS — BP 124/77 | HR 85 | Ht 72.0 in | Wt 232.0 lb

## 2022-09-09 DIAGNOSIS — R41840 Attention and concentration deficit: Secondary | ICD-10-CM

## 2022-09-09 MED ORDER — AMPHETAMINE-DEXTROAMPHETAMINE 10 MG PO TABS: 5.0000 mg | ORAL_TABLET | Freq: Two times a day (BID) | ORAL | 0 refills | Status: DC

## 2022-09-09 NOTE — Progress Notes (Signed)
Established patient visit   Patient: Lucas Erickson   DOB: 2000/10/26   22 y.o. Male  MRN: [id number] Visit Date: 09/09/2022   Chief Complaint  Patient presents with   ADHD   Subjective    HPI  Patient has concern for adult ADHD. States he is having trouble with focus, especially at work. 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 -He was administered adult ASRS 1.1 today and scored 6 out of 6. - Adult ADHD Self Report Scale (most recent)     Adult ADHD Self-Report Scale (ASRS-v1.1) Symptom Checklist - 09/09/22 1622     Part A               1. How often do you have trouble wrapping up the final details of a project, once the challenging parts have been done? (Abnormal) Very Often  2. How often do you have difficulty getting things done in order when you have to do a task that requires organization? (Abnormal) Often    3. How often do you have problems remembering appointments or obligations? (Abnormal) Very Often  4. When you have a task that requires a lot of thought, how often do you avoid or delay getting started? (Abnormal) Very Often    5. How often do you fidget or squirm with your hands or feet when you have to sit down for a long time? (Abnormal) Very Often  6. How often do you feel overly active and compelled to do things, like you were driven by a motor? (Abnormal) Often          Part B               7. How often do you make careless mistakes when you have to work on a boring or difficult project? (Abnormal) Very Often  8. How often do you have difficulty keeping your attention when you are doing boring or repetitive work? (Abnormal) Very Often    9. How often do you have difficulty concentrating on what people say to you, even when they are speaking to you directly? (Abnormal) Often  10. How often do you misplace or have difficulty finding things at home or at work? (Abnormal) Very Often    11. How often are you distracted by activity or noise around  you? (Abnormal) Very Often  12. How often do you leave your seat in meetings or other situations in which you are expected to remain seated? (Abnormal) Sometimes    13. How often do you feel restless or fidgety? (Abnormal) Often  14. How often do you have difficulty unwinding and relaxing when you have time to yourself? (Abnormal) Often    15. How often do you find yourself talking too much when you are in social situations? Sometimes  16. When you are in a conversation, how often do you find yourself finishing the sentences of the people you are talking to, before they can finish them themselves? (Abnormal) Very Often    17. How often do you have difficulty waiting your turn in situations when turn taking is required? (Abnormal) Often  18. How often do you interrupt others when they are busy? (Abnormal) Often                  Lack of focus is not getting him into trouble at work yet. -He is easily distracted. -Often starts many projects but does not finish them. -States this has been a problem  most of his life, but has noticed it becoming more of a problem over the last 1 years.   Medications: Outpatient Medications Prior to Visit  Medication Sig   [DISCONTINUED] Armodafinil 250 MG tablet Take 0.5-1 tablets (125-250 mg total) by mouth daily. Start by taking 1/2 tablet in morning, if tolerates or feels the additional 1/2 tablet is needed may increase to full tablet in the morning.   [DISCONTINUED] dicyclomine (BENTYL) 20 MG tablet Take 1 tablet (20 mg total) by mouth 2 (two) times daily as needed for spasms.   [DISCONTINUED] famotidine (PEPCID) 20 MG tablet Take 1 tablet (20 mg total) by mouth 2 (two) times daily.   [DISCONTINUED] fexofenadine (ALLEGRA) 180 MG tablet Take 1 tablet (180 mg total) by mouth daily.   [DISCONTINUED] omeprazole (PRILOSEC) 40 MG capsule Take 1 capsule (40 mg total) by mouth daily.   [DISCONTINUED] triamcinolone (NASACORT ALLERGY 24HR) 55 MCG/ACT AERO nasal  inhaler Place 2 sprays into the nose daily.   No facility-administered medications prior to visit.    Review of Systems See HPI     Objective     Today's Vitals   09/09/22 1613 09/09/22 1620  BP: (Abnormal) 143/90 124/77  Pulse: 85   Weight: 232 lb (105.2 kg)   Height: 6' (1.829 m)    Body mass index is 31.46 kg/m.  BP Readings from Last 3 Encounters:  09/09/22 124/77  12/18/21 108/68  11/27/21 110/69    Wt Readings from Last 3 Encounters:  09/09/22 232 lb (105.2 kg)  12/18/21 217 lb 1.9 oz (98.5 kg)  11/27/21 222 lb (100.7 kg)    Physical Exam Vitals and nursing note reviewed.  Constitutional:      Appearance: Normal appearance. He is well-developed.  HENT:     Head: Normocephalic and atraumatic.     Nose: Nose normal.     Mouth/Throat:     Mouth: Mucous membranes are moist.     Pharynx: Oropharynx is clear.  Eyes:     Extraocular Movements: Extraocular movements intact.     Conjunctiva/sclera: Conjunctivae normal.     Pupils: Pupils are equal, round, and reactive to light.  Neck:     Vascular: No carotid bruit.  Cardiovascular:     Rate and Rhythm: Normal rate and regular rhythm.     Pulses: Normal pulses.     Heart sounds: Normal heart sounds.  Pulmonary:     Effort: Pulmonary effort is normal.     Breath sounds: Normal breath sounds.  Abdominal:     Palpations: Abdomen is soft.  Musculoskeletal:        General: Normal range of motion.     Cervical back: Normal range of motion and neck supple.  Lymphadenopathy:     Cervical: No cervical adenopathy.  Skin:    General: Skin is warm and dry.     Capillary Refill: Capillary refill takes less than 2 seconds.  Neurological:     General: No focal deficit present.     Mental Status: He is alert and oriented to person, place, and time.  Psychiatric:        Mood and Affect: Mood normal.        Behavior: Behavior normal.        Thought Content: Thought content normal.        Judgment: Judgment normal.       Assessment & Plan    Attention and concentration deficit Assessment & Plan: Patient scored 6/6 on adult ASRS 1.1, self  reporting test for ADHD symptoms. -Trial Adderall 10 mg.  He may take 1/2 to 1 tablet up to twice daily if needed for focus and concentration.  Single 30-day prescription was sent to his pharmacy. -Reassess in 4 weeks.  Orders: -     Amphetamine-Dextroamphetamine; Take 0.5-1 tablets (5-10 mg total) by mouth 2 (two) times daily.  Dispense: 60 tablet; Refill: 0     Return in about 4 weeks (around 10/07/2022) for ADD.         Carlean Jews, NP  [facility] at [facility] [phone] (phone) [phone] (fax)  [facility]

## 2022-09-20 DIAGNOSIS — R41840 Attention and concentration deficit: Secondary | ICD-10-CM | POA: Insufficient documentation

## 2022-09-20 NOTE — Assessment & Plan Note (Signed)
Patient scored 6/6 on adult ASRS 1.1, self reporting test for ADHD symptoms. -Trial Adderall 10 mg.  He may take 1/2 to 1 tablet up to twice daily if needed for focus and concentration.  Single 30-day prescription was sent to his pharmacy. -Reassess in 4 weeks.

## 2022-10-07 ENCOUNTER — Ambulatory Visit: Payer: BC Managed Care – PPO | Admitting: Nurse Practitioner

## 2022-10-07 ENCOUNTER — Encounter: Payer: Self-pay | Admitting: Nurse Practitioner

## 2022-10-07 DIAGNOSIS — R41840 Attention and concentration deficit: Secondary | ICD-10-CM

## 2022-10-07 MED ORDER — AMPHETAMINE-DEXTROAMPHETAMINE 20 MG PO TABS
20.0000 mg | ORAL_TABLET | Freq: Two times a day (BID) | ORAL | 0 refills | Status: AC
Start: 2022-10-07 — End: ?

## 2022-10-07 MED ORDER — AMPHETAMINE-DEXTROAMPHETAMINE 20 MG PO TABS: 20.0000 mg | ORAL_TABLET | Freq: Two times a day (BID) | ORAL | 0 refills | Status: DC

## 2022-10-07 MED ORDER — AMPHETAMINE-DEXTROAMPHETAMINE 20 MG PO TABS
20.0000 mg | ORAL_TABLET | Freq: Two times a day (BID) | ORAL | 0 refills | Status: DC
Start: 2022-10-07 — End: 2022-10-07

## 2022-10-07 NOTE — Progress Notes (Signed)
Established patient visit   Patient: Lucas Erickson   DOB: [birth year]   22 y.o. Male  MRN: [id number] Visit Date: 10/07/2022   Chief Complaint  Patient presents with   Medical Management of Chronic Issues   Subjective    HPI  Follow up -recently started treatment for ADD -scored 6/6 on Part A of adullt ASRS 1.1, adult self assessmsnt for ADHD.  --started adderall 10 mg - taking 1/2 to  1 tablet  twice daily as needed  -states that he has definitely noted an improvement but feels like it's not quite enough.  -states that he does need to stay hydrated while taking this or he does get a headache.  -otherwise, he has no negative side effects.  -denies chest pain, palpitations, or jitteriness.  -is able to stay off the medication on the weekends.   Medications: Outpatient Medications Prior to Visit  Medication Sig   [DISCONTINUED] amphetamine-dextroamphetamine (ADDERALL) 10 MG tablet Take 0.5-1 tablets (5-10 mg total) by mouth 2 (two) times daily.   No facility-administered medications prior to visit.    Review of Systems See HPI      Objective     Today's Vitals   10/07/22 1535  BP: 121/76  Pulse: 84  SpO2: 98%  Weight: 222 lb (100.7 kg)  Height: 6' (1.829 m)   Body mass index is 30.11 kg/m.  BP Readings from Last 3 Encounters:  10/07/22 121/76  09/09/22 124/77  12/18/21 108/68    Wt Readings from Last 3 Encounters:  10/07/22 222 lb (100.7 kg)  09/09/22 232 lb (105.2 kg)  12/18/21 217 lb 1.9 oz (98.5 kg)    Physical Exam Vitals and nursing note reviewed.  Constitutional:      Appearance: Normal appearance. He is well-developed.  HENT:     Head: Normocephalic and atraumatic.     Nose: Nose normal.     Mouth/Throat:     Mouth: Mucous membranes are moist.     Pharynx: Oropharynx is clear.  Eyes:     Extraocular Movements: Extraocular movements intact.     Conjunctiva/sclera: Conjunctivae normal.     Pupils: Pupils are equal, round, and reactive to light.   Neck:     Vascular: No carotid bruit.  Cardiovascular:     Rate and Rhythm: Normal rate and regular rhythm.     Pulses: Normal pulses.     Heart sounds: Normal heart sounds.  Pulmonary:     Effort: Pulmonary effort is normal.     Breath sounds: Normal breath sounds.  Abdominal:     Palpations: Abdomen is soft.  Musculoskeletal:        General: Normal range of motion.     Cervical back: Normal range of motion and neck supple.  Lymphadenopathy:     Cervical: No cervical adenopathy.  Skin:    General: Skin is warm and dry.     Capillary Refill: Capillary refill takes less than 2 seconds.  Neurological:     General: No focal deficit present.     Mental Status: He is alert and oriented to person, place, and time.  Psychiatric:        Mood and Affect: Mood normal.        Behavior: Behavior normal.        Thought Content: Thought content normal.        Judgment: Judgment normal.       Assessment & Plan    Attention and concentration deficit Assessment & Plan: Increase Adderall  dosing to 20 mg, taking 10 to 20 mg twice daily as needed. -three 30-day prescription sent to his pharmacy today. -Reassess in 3 months.  Orders: -     Amphetamine-Dextroamphetamine; Take 1 tablet (20 mg total) by mouth 2 (two) times daily.  Dispense: 60 tablet; Refill: 0     Return in about 3 months (around 01/07/2023) for ADD.          Carlean Jews, NP  [facility] [phone] (phone) [phone] (fax)  [facility]

## 2022-10-19 NOTE — Assessment & Plan Note (Signed)
Increase Adderall dosing to 20 mg, taking 10 to 20 mg twice daily as needed. -three 30-day prescription sent to his pharmacy today. -Reassess in 3 months.

## 2023-01-07 ENCOUNTER — Ambulatory Visit: Payer: BC Managed Care – PPO | Admitting: Family Medicine

## 2023-01-07 NOTE — Progress Notes (Deleted)
Established Patient Office Visit  Subjective   Patient ID: Lucas Erickson, male    DOB: 17-Feb-2001  Age: 22 y.o. MRN: [id number]  No chief complaint on file.   HPI  Adderall 20 mg twice daily   The ASCVD Risk score (Arnett DK, et al., 2019) failed to calculate for the following reasons:   The 2019 ASCVD risk score is only valid for ages 79 to 2  Health Maintenance Due  Topic Date Due   HIV Screening  Never done   Hepatitis C Screening  Never done   COVID-19 Vaccine (1 - 2023-24 season) Never done      Objective:     There were no vitals taken for this visit. {Vitals History (Optional):23777}  Physical Exam   No results found for any visits on 01/07/23.      Assessment & Plan:   There are no diagnoses linked to this encounter.   No follow-ups on file.    Sandre Kitty, MD

## 2023-07-02 DIAGNOSIS — Z31440 Encounter of male for testing for genetic disease carrier status for procreative management: Secondary | ICD-10-CM | POA: Diagnosis not present
